# Patient Record
Sex: Female | Born: 1970 | Race: White | Hispanic: No | Marital: Married | Smoking: Never smoker
Health system: Southern US, Community
[De-identification: ages and names within clinical notes are randomized; demographics above are authoritative.]

## PROBLEM LIST (undated history)

## (undated) DIAGNOSIS — N951 Menopausal and female climacteric states: Principal | ICD-10-CM

## (undated) DIAGNOSIS — Z1329 Encounter for screening for other suspected endocrine disorder: Secondary | ICD-10-CM

## (undated) DIAGNOSIS — I10 Essential (primary) hypertension: Principal | ICD-10-CM

## (undated) DIAGNOSIS — Z1211 Encounter for screening for malignant neoplasm of colon: Secondary | ICD-10-CM

## (undated) DIAGNOSIS — E782 Mixed hyperlipidemia: Principal | ICD-10-CM

## (undated) DIAGNOSIS — K219 Gastro-esophageal reflux disease without esophagitis: Secondary | ICD-10-CM

## (undated) DIAGNOSIS — H5704 Mydriasis: Secondary | ICD-10-CM

## (undated) DIAGNOSIS — Z01419 Encounter for gynecological examination (general) (routine) without abnormal findings: Principal | ICD-10-CM

## (undated) DIAGNOSIS — G43909 Migraine, unspecified, not intractable, without status migrainosus: Secondary | ICD-10-CM

## (undated) DIAGNOSIS — R202 Paresthesia of skin: Principal | ICD-10-CM

## (undated) DIAGNOSIS — F329 Major depressive disorder, single episode, unspecified: Secondary | ICD-10-CM

## (undated) DIAGNOSIS — F32A Depression, unspecified: Secondary | ICD-10-CM

## (undated) HISTORY — DX: Major depressive disorder, single episode, unspecified: F32.9

## (undated) HISTORY — PX: NASAL SINUS SURGERY: SHX719

## (undated) HISTORY — DX: Depression, unspecified: F32.A

---

## 1998-07-06 ENCOUNTER — Ambulatory Visit: Admission: RE | Admit: 1998-07-06 | Discharge: 1998-07-06 | Payer: Self-pay

## 1998-09-18 ENCOUNTER — Ambulatory Visit: Admission: RE | Admit: 1998-09-18 | Discharge: 1998-09-18 | Payer: Self-pay | Admitting: Otolaryngology

## 2006-05-19 ENCOUNTER — Emergency Department (HOSPITAL_COMMUNITY): Admission: EM | Admit: 2006-05-19 | Discharge: 2006-05-19 | Payer: Self-pay | Admitting: Family Medicine

## 2010-03-06 HISTORY — PX: BREAST BIOPSY: SHX20

## 2012-06-13 ENCOUNTER — Encounter: Payer: Self-pay | Admitting: Certified Nurse Midwife

## 2012-06-17 ENCOUNTER — Encounter: Payer: Self-pay | Admitting: Certified Nurse Midwife

## 2012-06-17 ENCOUNTER — Telehealth: Payer: Self-pay | Admitting: Certified Nurse Midwife

## 2012-06-17 ENCOUNTER — Ambulatory Visit (INDEPENDENT_AMBULATORY_CARE_PROVIDER_SITE_OTHER): Payer: BC Managed Care – PPO | Admitting: Certified Nurse Midwife

## 2012-06-17 ENCOUNTER — Other Ambulatory Visit: Payer: Self-pay | Admitting: *Deleted

## 2012-06-17 VITALS — BP 120/80 | Ht 65.5 in | Wt 215.0 lb

## 2012-06-17 DIAGNOSIS — Z01419 Encounter for gynecological examination (general) (routine) without abnormal findings: Secondary | ICD-10-CM

## 2012-06-17 DIAGNOSIS — N644 Mastodynia: Secondary | ICD-10-CM

## 2012-06-17 DIAGNOSIS — Z0000 Encounter for general adult medical examination without abnormal findings: Secondary | ICD-10-CM

## 2012-06-17 DIAGNOSIS — E669 Obesity, unspecified: Secondary | ICD-10-CM | POA: Insufficient documentation

## 2012-06-17 LAB — COMPREHENSIVE METABOLIC PANEL
Alkaline Phosphatase: 89 U/L (ref 39–117)
Glucose, Bld: 96 mg/dL (ref 70–99)
Sodium: 139 mEq/L (ref 135–145)
Total Bilirubin: 0.3 mg/dL (ref 0.3–1.2)
Total Protein: 7.1 g/dL (ref 6.0–8.3)

## 2012-06-17 LAB — LIPID PANEL
HDL: 32 mg/dL — ABNORMAL LOW (ref >39)
LDL Cholesterol: 56 mg/dL (ref 0–99)
Total CHOL/HDL Ratio: 4.6 Ratio

## 2012-06-17 LAB — POCT URINALYSIS DIPSTICK
Glucose, UA: NEGATIVE
Leukocytes, UA: NEGATIVE
Nitrite, UA: NEGATIVE
Urobilinogen, UA: NEGATIVE

## 2012-06-17 NOTE — Progress Notes (Signed)
42 y.o. Married Caucasian female   [phone] here for annual exam. Periods normal , no issues.  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  Feels very fatigued at times, all 3 children go with her to work.  Patient has a special needs child age 54, legally blind and mentally challenged. Poor community and social resources available to her.  Spouse works full time and does help.  Recently stopped breast feeding last child.  Has tried to lose weight and really doesn't eat "a lot".  Usually one meal a day. Exercise with household and children.  Family leaves in [place], she lives in [place].  Fasting for lab work, was told at one point she had high glucose?? Does feel depressed, "I know what that feels like"   No other health issues.  Patient's last menstrual period was 06/02/2012.          Sexually active: yes  The current method of family planning is condom use all of the time.    Exercising: no  exercise Last mammogram: 2012 breast biopsy Last pap: 2013 Last BMD: none Alcohol: none Tobacco: none Colonoscopy: none  No health maintenance topics applied.  Family History  Problem Relation Age of Onset  . Hypertension Father   . Heart disease Father      age: in 42's heart attack  . Diabetes Maternal Aunt   . Breast cancer Paternal Aunt   . Diabetes Maternal Grandfather     There is no problem list on file for this patient.   Past Medical History  Diagnosis Date  . Depression     Past Surgical History  Procedure Laterality Date  . Cesarean section      times 3  . Nasal sinus surgery    . Breast biopsy Left 2012    Allergies: Penicillins  No current outpatient prescriptions on file.   No current facility-administered medications for this visit.    ROS: A comprehensive review of systems was negative.  Exam:    BP 120/80  Ht 5' 5.5" (1.664 m)  Wt 215 lb (97.523 kg)  BMI 35.22 kg/m2  LMP 06/02/2012 Weight change: @WEIGHTCHANGE @ Last 3 height  recordings:  Ht Readings from Last 3 Encounters:  06/17/12 5' 5.5" (1.664 m)   General appearance: alert, cooperative and appears stated age smiling. Father with her today to help with children Head: Normocephalic, without obvious abnormality, atraumatic Neck: no adenopathy, supple, symmetrical, trachea midline and thyroid not enlarged, symmetric, no tenderness/mass/nodules Lungs: clear to auscultation bilaterally Breasts: normal appearance, no masses or tenderness, No nipple retraction or dimpling Heart: regular rate and rhythm Abdomen: soft, non-tender; bowel sounds normal; no masses,  no organomegaly Extremities: extremities normal, atraumatic, no cyanosis or edema Skin: Skin color, texture, turgor normal. No rashes or lesions Lymph nodes: Cervical, supraclavicular, and axillary nodes normal. no inguinal nodes palpated Neurologic: Alert and oriented X 3, normal strength and tone. Normal symmetric reflexes. Normal coordination and gait   Pelvic: External genitalia:  no lesions              Urethra: normal appearing urethra with no masses, tenderness or lesions              Bartholins and Skenes: Bartholin's, Urethra, Skene's normal                 Vagina: normal appearing vagina with normal color and discharge, no lesions  Cervix: normal appearance              Pap taken: yes        Bimanual Exam:  Uterus:  uterus is normal size, shape, consistency and nontender, anteverted                                      Adnexa:    normal adnexa in size, nontender and no masses                                      Rectovaginal: Confirms                                      Anus:  normal sphincter tone, no lesions  A: Well woman exam,obese Contraception: Condoms Social stress with family and limited resources History of depression     P:Reviewed health and wellness pertinent to exam Mammogram yearly, pap smear as indicated Discussed the importance of regular meals to combat  fatigue and obesity. Also discussed exercise does help with weight and feelings of being overwhelmed.  Offered nutritional assessment referral.  Will consider and advise Labs CMP,Lipid panel,TSH, Vitamin D Discussed seeking family and friends support to help with family challenges, look at other resources outside the public sector in [place] with daughter. Discussed that sometimes fatigue is a sign of depression, patient aware but feels this is situational, will advise if feels it depression for medication use.  return annually or prn     Reviewed, TL  An After Visit Summary was printed and given to the patient.

## 2012-06-17 NOTE — Patient Instructions (Signed)

## 2012-06-17 NOTE — Telephone Encounter (Signed)
Order faxed to [facility] for Diagnostic Mammogram with Ultrasound if necessary for Breast Pain. Rachael Mcintyre. Per order of D.Leonard,CNM

## 2012-06-17 NOTE — Telephone Encounter (Signed)
[facility] calling fo a diagnostic mammo order pt is there now waiting on the order

## 2012-06-19 LAB — IPS PAP TEST WITH HPV

## 2012-06-20 ENCOUNTER — Telehealth: Payer: Self-pay | Admitting: Certified Nurse Midwife

## 2012-06-20 LAB — HEMOGLOBIN, FINGERSTICK: Hemoglobin, fingerstick: 13.9 g/dL (ref 12.0–16.0)

## 2012-06-20 NOTE — Telephone Encounter (Signed)
Amy and I did not know if you needed this information. ??sue

## 2012-06-20 NOTE — Telephone Encounter (Signed)
Will need office labs faxed to their office from her last visit

## 2012-06-20 NOTE — Telephone Encounter (Signed)
Pt calling to give the nurse information for the doctor she will be going to. Dr Cathlyn Parsons phone number [phone] and fax number is [phone]

## 2012-06-24 ENCOUNTER — Encounter: Payer: Self-pay | Admitting: *Deleted

## 2012-06-24 NOTE — Telephone Encounter (Signed)
No because it is incomplete until final report in

## 2012-06-24 NOTE — Telephone Encounter (Signed)
All lab results faxed to Dr. Cathlyn Parsons.

## 2012-06-26 ENCOUNTER — Telehealth: Payer: Self-pay

## 2012-06-26 NOTE — Telephone Encounter (Signed)
Patient put in 02 recall for 4/15

## 2014-01-05 ENCOUNTER — Encounter: Payer: Self-pay | Admitting: Certified Nurse Midwife

## 2015-01-20 NOTE — Procedures (Signed)
Formal Overnight Titration    [facility]. Elgie Collard, MD  Service Date: 01/20/2015    REFERRING PHYSICIAN:  Dr. Minus Liberty.    REASON FOR REFERRAL:  Probable obstructive sleep apnea.    HISTORY OF PRESENT ILLNESS:  This is a 44 year old female referred to  the [facility] who is married with 3 children, 3, 55  and 30 years of age and she is a Psychologist, sport and exercise.  Her biggest complaint  was she was always feeling tired and was snoring quite a bit.  It has  been going on for years and she would wake up a lot in the night.  She  is 5 feet 7 inches tall, weighs 195 pounds and has gained about 10  pounds in the last year.  She sleeps in the same room and bed with her  husband.  She is allergic to penicillin, has a history of depression  and has had cesarean sections in the past.  She is on Zoloft 50 mg a  day for a year.  There is a family history of obstructive sleep apnea  and hypertension, both in a brother and in her father.  The patient  has had a sleep study done previously in [place], [place] 12  years prior.  We do not have the results of that test.  She goes to  sleep about 10-11, gets up at 5:00 in the morning and on the weekends  or days that she does not work, she may sleep in until about 8:00 in  the morning.  She does not drink coffee, but she does drink  decaffeinated coffee and does drink tea.  She does not smoke and does  not use illicit drugs.  She functions best in the morning and in the  afternoon and worst in the evening.  She feels like she gets too  little sleep at night, has trouble falling asleep at night, waking up  in the morning and feels like her sleep is unsatisfactory.  She has  headaches in the morning and complains of snoring and gasping at  night.  She feels extremely tired and fatigued during the day and  sleepy and feels like her nighttime problems seem to affect her  daytime activity.  Her Epworth sleepiness score is not available in  the record.  For that  reason, she was set up for a whole night  polysomnogram at the [facility].    PROCEDURE:  A complete polysomnogram was obtained by measuring the  digital parameters recommended by The American Academy of Sleep  Medicine including EEG in 2 locations, EOG over the eyes, EMG of the  chin and legs, nasal and oral air flow, thoracic and abdominal effort  and continuous arterial oximetry as well as EKG.  Sleep and  respiration was scored in the standard, approved, accepted fashion.   This was done by a  registered polysomnographic technologist manually  on an epoch-by-epoch basis.     RESULTS:  Total recording time for the entire study was 504.1 minutes,  a total sleep time of 355.5 minutes, sleep efficiency of 70.5%.  Sleep  latency was 69.5 minutes, REM latency was 257.5 minutes.  She spent  15% of sleep time in stage I sleep, 55.8% in stage II sleep, 16.9% in  delta sleep and 12.2% in REM sleep.  No periodic limb movements were  seen.  The patient was studied on no nasal CPAP for the first 121  minutes of sleep time and had no REM sleep recorded at that level of  CPAP pressure.  The patient had 19 obstructive apneas and 1 central  apneic spell, as well as 60 obstructive hypopneas for overall  apnea-hypopnea index of 39.7 per hour.  Arousal index was very high at  50.1 per hour with O2 saturations dropping to as low as 88%.  She was  placed on 5, 7 and finally 9 cm CPAP pressure and did best on 9 cm  CPAP pressure, having been studied for 211.9 minutes of sleep with  43.5 minutes of REM sleep recorded.  The patient had only 2 central  apneic spells recorded on 9 cm CPAP pressure and the arousal index  dropped down to 18.1 per hour.  O2 saturations did not drop below 92%.   The patient spent 99.4% of total sleep time in supine position and  all of her events occurred in the supine position.  The REM index was  zero but REM sleep did not occur until she was on the highest level of  CPAP pressure.   The arousal index remained high throughout but was  lowest at the highest CPAP pressure recorded.    IMPRESSION:  Significant obstructive sleep apnea with desaturations  and sleep fragmentation, responds favorably to 9 cm CPAP pressure.   The patient did have delayed sleep onset with a long sleep latency as  well.  She may suffer from difficulty with initiation asleep and  maintenance of sleep, however, majority of that clearly is related to  her obstructive sleep apnea.    RECOMMENDATIONS:  Set the patient up with 9 cm CPAP pressure as soon  as possible as an outpatient.      Imogene BurnJ. Austin  Ball, MD  TR: *n DD: 01/27/2015 10:15 TD: 01/27/2015 11:18 Job#: [id number]     DOC#: [id number]           cc:  Otho BellowsJan Hendrick Burger MD

## 2015-02-04 LAB — HM MAMMOGRAPHY

## 2015-02-04 LAB — HM PAP SMEAR

## 2017-02-07 ENCOUNTER — Ambulatory Visit (INDEPENDENT_AMBULATORY_CARE_PROVIDER_SITE_OTHER): Payer: BLUE CROSS/BLUE SHIELD | Admitting: Family Medicine

## 2017-02-07 ENCOUNTER — Encounter: Payer: Self-pay | Admitting: Family Medicine

## 2017-02-07 VITALS — BP 124/80 | HR 92 | Ht 65.25 in | Wt 221.4 lb

## 2017-02-07 DIAGNOSIS — R1013 Epigastric pain: Secondary | ICD-10-CM

## 2017-02-07 LAB — COMPREHENSIVE METABOLIC PANEL
ALBUMIN: 3.9 g/dL (ref 3.5–5.2)
ALT: 14 U/L (ref 0–35)
AST: 15 U/L (ref 0–37)
Alkaline Phosphatase: 71 U/L (ref 39–117)
BUN: 15 mg/dL (ref 6–23)
CHLORIDE: 104 meq/L (ref 96–112)
CO2: 25 mEq/L (ref 19–32)
Calcium: 8.7 mg/dL (ref 8.4–10.5)
Creatinine, Ser: 0.76 mg/dL (ref 0.40–1.20)
GFR: 87.23 mL/min (ref >60.00)
Glucose, Bld: 88 mg/dL (ref 70–99)
POTASSIUM: 4.4 meq/L (ref 3.5–5.1)
SODIUM: 138 meq/L (ref 135–145)
Total Bilirubin: 0.4 mg/dL (ref 0.2–1.2)
Total Protein: 6.6 g/dL (ref 6.0–8.3)

## 2017-02-07 LAB — CBC
HEMATOCRIT: 40.1 % (ref 36.0–46.0)
Hemoglobin: 13.4 g/dL (ref 12.0–15.0)
MCHC: 33.3 g/dL (ref 30.0–36.0)
MCV: 90.8 fl (ref 78.0–100.0)
Platelets: 240 10*3/uL (ref 150.0–400.0)
RBC: 4.41 Mil/uL (ref 3.87–5.11)
RDW: 13.3 % (ref 11.5–15.5)
WBC: 8.1 10*3/uL (ref 4.0–10.5)

## 2017-02-07 LAB — LIPID PANEL
CHOL/HDL RATIO: 4
CHOLESTEROL: 126 mg/dL (ref 0–200)
HDL: 32.9 mg/dL — ABNORMAL LOW (ref >39.00)
NonHDL: 92.71
Triglycerides: 294 mg/dL — ABNORMAL HIGH (ref 0.0–149.0)
VLDL: 58.8 mg/dL — AB (ref 0.0–40.0)

## 2017-02-07 LAB — LIPASE: LIPASE: 13 U/L (ref 11.0–59.0)

## 2017-02-07 LAB — H. PYLORI ANTIBODY, IGG: H Pylori IgG: NEGATIVE

## 2017-02-07 LAB — LDL CHOLESTEROL, DIRECT: Direct LDL: 59 mg/dL

## 2017-02-07 LAB — TSH: TSH: 1.63 u[IU]/mL (ref 0.35–4.50)

## 2017-02-07 NOTE — Patient Instructions (Signed)
We will check blood work today. We may need a cat scan if we dont find an answer.  We will call you with the next steps once the results come back  Take care,  Dr Jimmey RalphParker

## 2017-02-07 NOTE — Progress Notes (Signed)
Subjective:  Rachael Mcintyre is a 46 y.o. female who presents today with a chief complaint of abdominal pain and to establish care.   HPI:  Abdominal Pain, New Issue Several year history.  Noticed that her symptoms started after the birth of her last child about 10 years ago.  Patient notes that she has had a history of 3 prior C-sections.  Was told that after her last C-section that she should not have any further children due to the significant amount of scar tissue that she had in her abdomen and uterus.  Symptoms have been stable over the last couple of years.  Pain is mostly located in her upper abdomen, however can be located all over.  Pain is described as a sharp, pulling sensation.  Pain is little worse after eating.  Occasionally will get bloating after eating.  She has made several dietary modifications including stopping potatoes, bread, and other starchy foods.  No this is significantly seem to help.  Over the past 2 weeks she has noticed that she has become more constipated.  Occasional nausea without vomiting.  Pain is causing her to have more back and neck pain.  Patient is concerned because she is taking care of her 45 year old disabled child and is not sure if the pain persist if she will be able to adequately care for her.  No fevers or chills.  ROS: Per HPI, otherwise a 14 point review of systems was performed and was negative  PMH:  The following were reviewed and entered/updated in epic: Past Medical History:  Diagnosis Date  . Depression    Patient Active Problem List   Diagnosis Date Noted  . Obesity, unspecified 06/17/2012  . Mastodynia 06/17/2012   Past Surgical History:  Procedure Laterality Date  . BREAST BIOPSY Left 2012  . CESAREAN SECTION     times 3  . NASAL SINUS SURGERY      Family History  Problem Relation Age of Onset  . Hypertension Father   . Heart disease Father         age: in 650's heart attack  . Alcohol abuse Father   . Early  death Father   . Hyperlipidemia Father   . Diabetes Maternal Aunt   . Breast cancer Paternal Aunt   . Diabetes Maternal Grandfather   . Hypertension Mother   . Hypertension Son   . Early death Paternal Grandmother   . Hyperlipidemia Paternal Grandmother   . Hypertension Paternal Grandmother   . Stroke Paternal Grandmother   . Early death Paternal Grandfather   . Hypertension Brother   . Seizures Brother     Medications- reviewed and updated Current Outpatient Medications  Medication Sig Dispense Refill  . venlafaxine (EFFEXOR) 25 MG tablet Take 25 mg by mouth once.     No current facility-administered medications for this visit.     Allergies-reviewed and updated Allergies  Allergen Reactions  . Penicillins Swelling    Social History   Socioeconomic History  . Marital status: Married    Spouse name: None  . Number of children: None  . Years of education: None  . Highest education level: None  Social Needs  . Financial resource strain: None  . Food insecurity - worry: None  . Food insecurity - inability: None  . Transportation needs - medical: None  . Transportation needs - non-medical: None  Occupational History  . None  Tobacco Use  . Smoking status: Never Smoker  . Smokeless tobacco: Never  Used  Substance and Sexual Activity  . Alcohol use: No  . Drug use: No  . Sexual activity: Yes    Partners: Male    Birth control/protection: Condom  Other Topics Concern  . None  Social History Narrative  . None   Objective:  Physical Exam: BP 124/80   Pulse 92   Ht 5' 5.25" (1.657 m)   Wt 221 lb 6.4 oz (100.4 kg)   LMP 01/15/2017   SpO2 99%   BMI 36.56 kg/m   Gen: NAD, resting comfortably CV: RRR with no murmurs appreciated Pulm: NWOB, CTAB with no crackles, wheezes, or rhonchi GI: Obese, normal bowel sounds present. Soft, Nontender, Nondistended. MSK: No edema, cyanosis, or clubbing noted Skin: Warm, dry Neuro: Grossly normal, moves all  extremities Psych: Normal affect and thought content  Assessment/Plan:  Epigastric pain Unclear etiology.  Broad differential.  Will start with basic lab work including CBC, CMET, lipase, H. pylori, TSh.  Will likely need abdominal CT if above workup negative.  She has recent constipation which may be contributing, however given that this is relatively new symptom doubt that this is the main source of her pain.  Given her history of significant scar tissue may eventually require surgical consultation.  She does not have any signs or symptoms of a bowel obstruction currently.  Return precautions reviewed.  Preventive healthcare Up-to-date on flu shot.  Will check lipid panel with blood work today.  Time Spent: I spent 45 minutes face-to-face with the patient, with more than half spent on counseling for the diagnostic workup for her abdominal pain including laboratory testing, advanced imaging, and possible surgical referral.  Additionally spent time on discussion of warning signs and reasons to return to care.   Katina Degreealeb M. Jimmey RalphParker, MD 02/07/2017 10:15 AM

## 2017-02-08 NOTE — Progress Notes (Signed)
Her triglycerides were little high and her  "good" cholesterol was a little low.  No need to start medications.  Continue working on diet and exercise.    Other labs are all normal-no explanation for abdominal pain.  We will need to get an abdominal CT scan with contrast for further evaluation.  Please inform patient.  Katina Degreealeb M. Jimmey RalphParker, MD 02/08/2017 12:48 PM

## 2017-02-09 ENCOUNTER — Other Ambulatory Visit: Payer: Self-pay

## 2017-02-09 DIAGNOSIS — R1084 Generalized abdominal pain: Secondary | ICD-10-CM

## 2017-02-09 DIAGNOSIS — R109 Unspecified abdominal pain: Secondary | ICD-10-CM

## 2017-02-13 ENCOUNTER — Encounter: Payer: Self-pay | Admitting: Family Medicine

## 2017-02-28 ENCOUNTER — Other Ambulatory Visit: Payer: Self-pay | Admitting: Family Medicine

## 2017-02-28 ENCOUNTER — Ambulatory Visit
Admission: RE | Admit: 2017-02-28 | Discharge: 2017-02-28 | Disposition: A | Discharge status: Home / Self Care | Payer: BLUE CROSS/BLUE SHIELD | Source: Ambulatory Visit | Attending: Family Medicine | Admitting: Family Medicine

## 2017-02-28 DIAGNOSIS — R109 Unspecified abdominal pain: Secondary | ICD-10-CM

## 2017-02-28 DIAGNOSIS — R1084 Generalized abdominal pain: Secondary | ICD-10-CM

## 2017-02-28 MED ORDER — IOPAMIDOL (ISOVUE-300) INJECTION 61%
100.0000 mL | Freq: Once | INTRAVENOUS | Status: AC | PRN
  Administered 2017-02-28: 100 mL via INTRAVENOUS

## 2017-03-01 ENCOUNTER — Telehealth: Payer: Self-pay | Admitting: Family Medicine

## 2017-03-01 NOTE — Telephone Encounter (Signed)
See note

## 2017-03-01 NOTE — Telephone Encounter (Signed)
Copied from CRM #[id number]. Topic: Quick Communication - See Telephone Encounter °>> Mar 01, 2017 12:01 PM Lambe, Annette S wrote: °CRM for notification. See Telephone encounter for:  °Patient is calling wanting CT Results.  She is in [place] today. °03/01/17. °

## 2017-03-01 NOTE — Telephone Encounter (Deleted)
Copied from CRM [phone]. Topic: Quick Communication - See Telephone Encounter >> Mar 01, 2017 12:01 PM Cipriano BunkerLambe, Annette S wrote: CRM for notification. See Telephone encounter for:  Patient is calling wanting CT Results.  [place]. 03/01/17.

## 2017-03-02 NOTE — Telephone Encounter (Signed)
Pt calling again today and would like a call about her CT results and if they can be released to be in her MyChart. Please advise.

## 2017-03-02 NOTE — Telephone Encounter (Signed)
Please see note and call patient

## 2017-03-02 NOTE — Telephone Encounter (Signed)
Spoke with patient and gave results.  Patient verbalized understanding.  Also sending results and recommendations via MyChart.

## 2017-03-02 NOTE — Progress Notes (Signed)
No structural abnormalities found on patient's CT scan. She does have a large amount of constipation that could be causing her symptoms - including the diarrhea. She should start a bowel regimen of colace 200mg  2-3 times daily and 1-2 packets of miralax daily as needed to have 1-2 soft bowel movements daily.  She should also be on a fiber supplement if she is not already on one and make sure to drink plenty of fluids.   I would like to see her back within the next couple of weeks to re-evaluated and discuss further.  Katina Degreealeb M. Jimmey RalphParker, MD 03/02/2017 8:56 AM

## 2017-03-04 ENCOUNTER — Encounter: Payer: Self-pay | Admitting: Family Medicine

## 2017-03-05 ENCOUNTER — Other Ambulatory Visit: Payer: Self-pay

## 2017-03-05 MED ORDER — VENLAFAXINE HCL 25 MG PO TABS: 25.0000 mg | ORAL_TABLET | Freq: Once | ORAL | 0 refills | Status: AC

## 2019-10-06 LAB — HEMOGLOBIN A1C
Estimated Avg Glucose, External: 94 mg/dL (ref 77–114)
Hemoglobin A1C, External: 4.9 % (ref 4.3–5.6)

## 2020-05-05 LAB — CBC WITH AUTO DIFFERENTIAL
Absolute Baso #: 0.1 10*3/uL (ref 0.0–0.2)
Absolute Eos #: 0.2 10*3/uL (ref 0.0–0.5)
Absolute Lymph #: 2.3 10*3/uL (ref 1.0–3.2)
Absolute Mono #: 0.6 10*3/uL (ref 0.3–1.0)
Basophils %: 0.7 % (ref 0.0–2.0)
Eosinophils %: 1.3 % (ref 0.0–7.0)
Hematocrit: 39.1 % (ref 34.0–47.0)
Hemoglobin: 13.4 g/dL (ref 11.5–15.7)
Immature Grans (Abs): 0.12 10*3/uL — ABNORMAL HIGH (ref 0.00–0.06)
Immature Granulocytes: 1 % — ABNORMAL HIGH (ref 0.0–0.6)
Lymphocytes: 19.4 % (ref 15.0–45.0)
MCH: 30.6 pg (ref 27.0–34.5)
MCHC: 34.3 g/dL (ref 32.0–36.0)
MCV: 89.3 fL (ref 81.0–99.0)
MPV: 9.9 fL (ref 7.2–13.2)
Monocytes: 5.4 % (ref 4.0–12.0)
NRBC Absolute: 0 10*3/uL (ref 0.000–0.012)
NRBC Automated: 0 % (ref 0.0–0.2)
Neutrophils %: 72.2 % (ref 42.0–74.0)
Neutrophils Absolute: 8.4 10*3/uL — ABNORMAL HIGH (ref 1.6–7.3)
Platelets: 306 10*3/uL (ref 140–440)
RBC: 4.38 x10e6/mcL (ref 3.60–5.20)
RDW: 13.1 % (ref 11.0–16.0)
WBC: 11.6 10*3/uL — ABNORMAL HIGH (ref 3.8–10.6)

## 2020-05-05 LAB — PROGESTERONE: Progesterone: 15.99 ng/mL

## 2020-05-05 LAB — TSH WITH REFLEX TO FT4: TSH: 1.72 mcIU/mL (ref 0.358–3.740)

## 2020-05-05 LAB — FOLLICLE STIMULATING HORMONE: FSH: 4.02 IU/mL

## 2020-05-07 LAB — TESTOSTERONE, FREE/TOT EQUILIB
Testosterone % Free: 1.53 % (ref 0.50–2.80)
Testosterone, Free: 0.57 ng/dL (ref 0.10–0.85)
Testosterone: 37 ng/dL (ref 4–50)

## 2021-02-03 ENCOUNTER — Encounter: Admit: 2021-02-03 | Discharge: 2021-02-04 | Payer: MEDICAID | Attending: Obstetrics & Gynecology

## 2021-02-03 DIAGNOSIS — Z01419 Encounter for gynecological examination (general) (routine) without abnormal findings: Secondary | ICD-10-CM

## 2021-02-03 MED ORDER — MEDROXYPROGESTERONE ACETATE 10 MG PO TABS
10 MG | ORAL_TABLET | Freq: Every day | ORAL | 3 refills | Status: DC
Start: 2021-02-03 — End: 2021-05-31

## 2021-02-03 NOTE — Addendum Note (Signed)
Addended by: Judithe Modest on: 02/03/2021 03:55 PM     Modules accepted: Orders

## 2021-02-03 NOTE — Progress Notes (Signed)
CC: Patient presents today for Annual Exam (Pt is here for yearly exam.)    HPI: see assessment and plan    OB History       Gravida   4    Para   3    Term   2    Preterm   1    AB   1    Living   3         SAB   1    IAB        Ectopic        Molar        Multiple        Live Births   3                GYN History     No results found for: Paulita Cradle, METHOD, [id number], HPV16      Past Medical History:  Past Medical History:   Diagnosis Date    Anxiety     Hypertension     Sleep apnea        Past Surgical History:  Past Surgical History:   Procedure Laterality Date    CESAREAN SECTION      DILATION AND CURETTAGE      SINUS SURGERY         Allergies:   Allergies   Allergen Reactions    Penicillins Anaphylaxis and Swelling     Other reaction(s): swelling  swelling  swelling         Medication History:  Current Outpatient Medications   Medication Sig Dispense Refill    fluticasone (FLONASE) 50 MCG/ACT nasal spray twice a day      lisinopril (PRINIVIL;ZESTRIL) 5 MG tablet 1 tablet Orally Once a day for 30 day(s)      nortriptyline (PAMELOR) 10 MG capsule 3 Orally hs for 30 day(s)      sertraline (ZOLOFT) 50 MG tablet 1 tablet Orally Once a day      zolpidem (AMBIEN) 10 MG tablet 1 tablet at bedtime as needed Orally Once a day for 30 days       No current facility-administered medications for this visit.       Social History:  Social History     Socioeconomic History    Marital status: Unknown     Spouse name: Not on file    Number of children: Not on file    Years of education: Not on file    Highest education level: Not on file   Occupational History    Not on file   Tobacco Use    Smoking status: Never    Smokeless tobacco: Never   Vaping Use    Vaping Use: Never used   Substance and Sexual Activity    Alcohol use: Not Currently    Drug use: Never    Sexual activity: Yes     Partners: Male   Other Topics Concern    Not on file   Social History Narrative    Not on file     Social Determinants of Health      Financial Resource Strain: Not on file   Food Insecurity: Not on file   Transportation Needs: Not on file   Physical Activity: Not on file   Stress: Not on file   Social Connections: Not on file   Intimate Partner Violence: Not on file   Housing Stability: Not on file       Family  History:  Family History   Problem Relation Age of Onset    Diabetes Paternal Grandmother     Heart Disease Paternal Grandmother     Diabetes Maternal Grandmother     Stroke Maternal Grandmother     Heart Attack Father        Objective:   BP 136/75    Wt 234 lb (106.1 kg)   Urine:@LASTPROCAMB (POC81001;POC81003;poc81002;poc81000;POC81001F;POC81003E)@  Hemoglobin: No components found for: [facility]   The patient appears well, alert, oriented x 3, in no distress.    PHYSICAL EXAM:    General Examination:  GENERAL APPEARANCE: alert, well hydrated, in no distress.   HEAD: normocephalic, atraumatic.   EYES: no lid lag, stare or proptosis, extraocular movement full and smooth.   NECK: thyroid normal.   RESPIRATORY: unlabored.   ABDOMEN: normal, soft, nontender, nondistended.   SKIN: normal, no suspicious lesions.   EXTREMITIES: no edema, normal, full range of motion.   NEUROLOGIC: alert and oriented, gait normal.       BREAST EXAM:    General Examination:  GENERAL APPEARANCE: pleasant, well nourished, well developed, in no acute distress.   NECK: no thyromegaly, neck supple.   LUNGS: good air movement.   BREASTS: no dimpling, no discharge, no masses palpable bilaterally, nontender.   ABDOMEN: soft, nontender, nondistended.            PELVIC EXAM:   Examination:   Gynecological:  EXTERNAL GENITALIA: normal, introitus normal.   URETHRA: meatus normal.   BLADDER: normal.   VAGINA: healthy pink mucosa without any lesions.   CERVIX: no cervical movement tenderness, no lesions.   UTERUS: normal mobility, nontender.   ADNEXA: no mass, nontender.   ANUS/PERINEUM: normal.                Assessment/Plan:   02/03/2021 annual, 50 year old P3 single mother who  cares for one of her children who is severely disabled.  She has a history of irregular cycles and have been giving her Provera withdrawal every 35 days but she ran out of it and has not had a flow in about 2 months so I will refill that and recommend that she take it monthly if she does not have a flow.  Mammogram order given.  I will refer her for colonoscopy screening and Hemoccult was negative today.  I will also refer her to establish care with primary care.  She stopped her Pamelor and has been off of it for about 3 months but does see cognitive behavioral therapy for her depression and PTSD.  Monthly SBE recommended with a healthy lifestyle.  Pap smear performed.  Headache without    09/20/20 follow-up virtual visit, this is a 49 year old P3 single mother  who also cares for one of her children who is disabled. In the past she  was having irregular cycles and I given her progesterone withdrawal  to take every 35 days but she actually has not needed it for the last 3  or 4 months and her cycles are coming about once a month only  lasting 3 days and light. I will also given her Pamelor because she was  having some anxiety but it was giving her headaches that she  discontinued it and she is doing some cognitive behavioral therapy  and sees a counselor as well and is actually not any medication other  than her hypertension medication. I'll plan on seeing her in October  for her annual. She had her last mammogram and [facility] and says  that  her breasts were dense and was asking if she needed an MRI and I  offered her that option but explained that it may be costly and that I  highly recommend monthly self breast exams and notify me if she has  the abnormalities. We will schedule screening mammogram I see her  in October. It her PCP dislodged and [facility] recommend she establish  care with a new one with that group  05/19/20 virtual visit follow-up, 50 year old P3 single mother. Of note  one of her children is disabled and  very difficult for her to manage.  She is been struggling with menstrual cycles every other month and  recently started weight loss with exercise injection had a normal cycle  just recently. She is also try the Ambien for insomnia and she takes  half a tablet several times a week and she is also taking a natural  supplement at helping her as well. She still having pretty significant  depression as a single mom with a disabled child but had problems in  the past Zoloft I will initiate nortriptyline, Pamelor, at bedtime to see  if this helps and we'll plan for a virtual visit in 4 months to see how  she is doing.  1 insomnia,continue Ambien and natural remedies  2 metromenorrhagia, continue low carbohydrate diet, reviewed normal  hormones and FSH 4 we'll give Provera withdrawal as needed if she  does not have a cycle within 35 days and 6 refills given  Preventative, annual due in October  05/04/20 new patient problem visit, 50 year old P3 and one of her kids is  medical reasons in the past 12 months?  None  Alcohol Screen: Denies  Did you have a drink containing alcohol in  the past year? 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  Oldest child disabled 2/2 prematurity.  Gyn History  Periods : every month, x5-7d.  Last pap smear results normal.  Sexual activity not currently sexually active.  Last pap smear date 10/2019 Neg Pap, Neg  HPV.  Last mammogram date 12/2019- Benign.  Mammogram completed  within the last year no  Abnormal pap smear none.  Date of Last Period 04/12/20.  Sexually Transmitted Diseases (STDs) none.  Birth control condoms, timing.  Menarche:  Age of Menarche 24  OB History  GPAL: G4 P3 A1.  Pregnancy # 1: SAB.  Pregnancy # 2: 03/2006 - 1CS @ 25 wks, F,  420g. c/b IUGR, abnl dopplers. CP.  Pregnancy # 3 04/2008 - RCS, F, 7#5.  Pregnancy # 4: 12/2011 - RCS, F, 8#.  Allergies  pcn: swelling  Hospitalization/Major  Diagnostic Procedure  see  surgical  Review of  Systems  General/Constitutional:  Fever denies. Chills denies.  Headache denies. Fatigue denies. Weight  loss denies.  ENT:  Sore throat denies. Hearing  loss denies.  Ophthalmologic:  Double Vision denies. Blurred  vision denies.  Endocrine:  Denies Cold intolerance. Denies Heat  intolerance.  Respiratory:  Shortness of breath denies.  Cough denies. Wheezing denies.  Breast:  Breast lump denies. Breast  swelling denies. Nipple discharge denies.  Red skin denies. Tenderness denies. Skin  changes denies.  Cardiovascular:  Chest pain denies. Fainting denies.  significantly disabled, she is a single mother. Complaining of cycles  every other month which are light also feels that she hasn't hormone  imbalance because she's having difficulty losing weight and having  unusual sensation slight tingling in her  feet in her scalp. She has  anxiety and sees a Veterinary surgeon. She has difficulty sleeping as well.  1 insomnia;Will initiate Ambien and risk and benefits reviewed at  length  2 perimenopausal status;we'll check lab work and do a virtual visit to  review the symptoms, I think that she likely has some hormonal  imbalance and may benefit from treatment but she also has significant  anxiety and other social issues  3 preventative, mammogram due in October as well as Pap smear.

## 2021-02-07 LAB — PAP IG, LIQUID-BASED RFX APTIMA HPV WHEN HPV ASCU 16/18,45 (199340): .: 0

## 2021-02-10 ENCOUNTER — Ambulatory Visit: Admit: 2021-02-10 | Discharge: 2021-02-10 | Payer: MEDICAID | Attending: Family

## 2021-02-10 DIAGNOSIS — U071 COVID-19: Secondary | ICD-10-CM

## 2021-02-10 LAB — POC COVID-19 & INFLUENZA COMBO (LIAT IN HOUSE)
INFLUENZA A: NOT DETECTED
INFLUENZA B: NOT DETECTED
SARS-CoV-2: DETECTED — AB

## 2021-02-10 MED ORDER — IBUPROFEN 600 MG PO TABS
600 MG | ORAL_TABLET | Freq: Four times a day (QID) | ORAL | 0 refills | Status: AC | PRN
Start: 2021-02-10 — End: 2022-04-05

## 2021-02-10 MED ORDER — IBUPROFEN 600 MG PO TABS
600 MG | ORAL_TABLET | Freq: Four times a day (QID) | ORAL | 0 refills | Status: DC | PRN
Start: 2021-02-10 — End: 2021-02-10

## 2021-02-10 NOTE — Progress Notes (Signed)
Subjective:      Patient ID: Shannon Marshall     HPI The patient is a 50 y.o. female presents with complaints of subjective fever, headache, nasal congestion, rhinitis, sore throat, cough, shortness of breath, and diarrhea that began 02/05/2019.  Patient denies abdominal pain, nausea, and vomiting.  The patient's daughter is currently sick with COVID.    Patient also complains of neck pain for the past several days.  States that over the past 24 hours, she has developed pain that radiates into the left arm.  No musculoskeletal weakness or paresthesias.  No specific trauma, but the patient is the sole caregiver for her daughter who requires total care.  Patient states that she believes that she most likely transferred her daughter and injured herself in the process.  Has taken Tylenol with little relief in pain.    Review of Systems: As noted in the HPI    Past Medical History:   Diagnosis Date    Anxiety     Hypertension     Sleep apnea         Past Surgical History:   Procedure Laterality Date    CESAREAN SECTION      DILATION AND CURETTAGE      SINUS SURGERY          Allergies   Allergen Reactions    Penicillins Anaphylaxis and Swelling     Other reaction(s): swelling  swelling  swelling          Current Outpatient Medications   Medication Sig Dispense Refill    ibuprofen (ADVIL;MOTRIN) 600 MG tablet Take 1 tablet by mouth every 6 hours as needed for Pain 20 tablet 0    medroxyPROGESTERone (PROVERA) 10 MG tablet Take 1 tablet by mouth daily for 21 days Take monthly for 7 days if no menstrual cycle spontaneously 21 tablet 3    fluticasone (FLONASE) 50 MCG/ACT nasal spray twice a day      lisinopril (PRINIVIL;ZESTRIL) 5 MG tablet 1 tablet Orally Once a day for 30 day(s)       No current facility-administered medications for this visit.        BP 132/82    Pulse (!) 108    Temp 98.6 ??F (37 ??C)    Resp 20    Ht 5\' 6"  (1.676 m)    Wt 234 lb (106.1 kg)    SpO2 97%    BMI 37.77 kg/m??       Objective:   Physical  Exam  Vitals and nursing note reviewed.   Constitutional:       General: She is in acute distress.      Appearance: Normal appearance. She is normal weight. She is not toxic-appearing or diaphoretic.   HENT:      Head: Normocephalic and atraumatic.      Right Ear: Ear canal and external ear normal.      Left Ear: Ear canal and external ear normal.      Ears:      Comments: Bilateral tympanic membranes are noted to be intact and dull in appearance     Nose: Congestion and rhinorrhea present.      Mouth/Throat:      Mouth: Mucous membranes are moist.      Pharynx: No oropharyngeal exudate or posterior oropharyngeal erythema.   Eyes:      Extraocular Movements: Extraocular movements intact.      Conjunctiva/sclera: Conjunctivae normal.   Cardiovascular:      Rate and  Rhythm: Normal rate and regular rhythm.      Pulses: Normal pulses.      Heart sounds: Normal heart sounds. No murmur heard.    No friction rub. No gallop.   Pulmonary:      Effort: Pulmonary effort is normal. No respiratory distress.      Breath sounds: Normal breath sounds. No stridor. No wheezing, rhonchi or rales.   Musculoskeletal:      Comments: Tenderness to palpation over C5-C6, no crepitus or step-off appreciated, pain in the left upper back and left upper arm reproducible with range of motion at the neck, left shoulder nontender to palpation, left shoulder has full range of motion without pain   Skin:     General: Skin is warm and dry.   Neurological:      General: No focal deficit present.      Mental Status: She is alert and oriented to person, place, and time.   Psychiatric:         Mood and Affect: Mood normal.         Behavior: Behavior normal.         Thought Content: Thought content normal.         Judgment: Judgment normal.       No scans are attached to the encounter.     Assessment:   1. COVID-19  2. Acute cough  -     POC COVID-19 & Influenza Combo ([facility])  3. Cervical radiculopathy  -     ibuprofen (ADVIL;MOTRIN) 600 MG  tablet; Take 1 tablet by mouth every 6 hours as needed for Pain, Disp-20 tablet, R-0Normal     Plan:     Results for orders placed or performed in visit on 02/10/21   POC COVID-19 & Influenza Combo ([facility])   Result Value Ref Range    SARS-CoV-2 Detected (A) Not Detected    INFLUENZA A Not Detected Not Detected    INFLUENZA B Not Detected Not Detected    Narrative    Employed in healthcare setting?->  Resident in a congregate (group) care setting?->      Rapid COVID testing positive.  Rapid influenza testing negative. The patient was provided with verbal education regarding the Centers for Disease Control and Prevention guidelines.  Discussed symptomatic management.  Patient's neck pain is consistent with cervical radiculopathy.  We will place the patient on ibuprofen 600 mg every 6 to 8 hours with food.  Instructed on how to administer and possible side effects.  We did discuss applying heat to the neck for 15 to 20-minute increments minimum of 3 times daily.  Patient was given written discharge instructions that were downloaded from Up-to-Date.  Advised the patient to follow-up immediately for any new or worsening symptoms.  Strict ED precautions discussed.  The patient verbalized understanding and agreement with plan of care.           Vernelle Emerald, APRN - NP

## 2021-02-21 NOTE — Telephone Encounter (Signed)
Pt called and said she tried to schedule her colonoscopy and mammo central scheduling can not see the order. Pt is requesting new orders to be placed or faxed. Please assist

## 2021-02-24 NOTE — Telephone Encounter (Signed)
Colorectal Surgery           CPT   9728092771.         DX  Z12.11.         Surgical Procedure  Colonoscopy.         Place of service  Endoscopy.         Location  [facility].         Preparation  Miralax Gatorade.         Anesthesia  MAC.         Length of surgery  30 min.

## 2021-02-25 ENCOUNTER — Encounter

## 2021-02-25 NOTE — Telephone Encounter (Signed)
Please email packet  [email or web address]

## 2021-03-02 NOTE — Progress Notes (Signed)
Pre Procedure Patient Instructions    Procedure Location hospital:[facility]: [address], [place] - [place] in front of Hospital Main Entrance and check in at [employer].    Procedure Date 03/11/21  Arrival Time  0800      Medications:  Medication to be taken the morning of surgery with a few sips of water only:   Hold or reduce the dosage of the following medication, as discussed: Lisinopril  Do not take diet medications for 4 days prior to your procedure  Stop all supplements, vitamins and herbal remedies one week prior to your procedure, unless your doctor told you to continue taking.  Do not take over the counter pain medications except plain Tylenol or acetaminophen unless your doctor told you to do so.  If you are taking blood thinners, call the doctor performing your procedure and prescribing physician for instructions on when to stop.    Procedure Preparatio    Diet Restrictions:    Skin Preparation:   Wash with Hibiclens or an antibacterial soap (e.g. Dial soap) the night before and morning of procedure.  Using a clean washcloth, wash from neck to toes for 3 minutes.Gently clean the area where your surgery will be done thoroughly  Do not use Hibiclens on or near your face, eyes, ears, or head  Do not put on any deodorants, lotions, powders, or oils afterwards. Be sure to put on clean clothing.    Other Preparation:  Call your surgeon right away if you get any wounds, cuts, scrapes, scabs, rashes, bug bites at or near your operative site.  Bowel prep as instructed by your doctor  Fleets enema as instructed by your doctor  No test required before surgery      Day of Procedure Patient Instruction:  Do not smoke, vape, chew tobacco, drink alcohol or use recreational drugs on the day of your procedure  Remove all jewelry, piercings and metal accessories  Do not wear contacts, tampons, make-up, lotions, creams, powders, fragrances or deodorant  Do not bring valuables or money  Bring a picture ID and  insurance card and any of the following that are applicable to you:    ?? Inhalers    ?? CPAP or BiPAP machine    ?? Remote for spinal cord stimulator or other implanted device    ?? Insulin pump supplies    ?? Walker or other orthopedic device necessary for postop    ?? Storage case for eyeglasses, hearing aids, dentures, etc    ?? A loose button-up shirt if instructed    ?? Copy of your Living Will and/or Medical Durable Power of Attorney    ?? List of current medications including name and dosage    .  If you are going home the same day as your procedure, a support person should accompany you to the facility and must transport you home.  If you plan to take public transportation of any sort, your support person must accompany you home.  You will need someone to stay with you for 24 hours after your procedure with sedation of any kind.       COVID Testing Instructions:  No COVID testing required as discussed.       Comments:     The information and visitor policy was reviewed with you during your Pre-Admission Testing interview and you verbalized understanding. If you have any additional questions please contact [phone]    For financial questions regarding your procedure at a [facility] facility, please contact [phone], option 1

## 2021-03-08 NOTE — Telephone Encounter (Signed)
Patient is requesting a call back to  reschedule her 03/10/21 colonoscopy appointment. Patient has a cold and is on her menstrual cycle. Please advise

## 2021-03-09 NOTE — Telephone Encounter (Signed)
Please email packet  [email or web address]

## 2021-03-09 NOTE — Telephone Encounter (Signed)
Colorectal Surgery           CPT   45378.         DX  Z12.11.         Surgical Procedure  Colonoscopy.         Place of service  Endoscopy.         Location  [facility].         Preparation  Miralax Gatorade.         Anesthesia  MAC.         Length of surgery  30 min.

## 2021-03-23 ENCOUNTER — Inpatient Hospital Stay: Admit: 2021-03-23 | Payer: MEDICAID

## 2021-03-23 DIAGNOSIS — Z1231 Encounter for screening mammogram for malignant neoplasm of breast: Secondary | ICD-10-CM

## 2021-03-31 ENCOUNTER — Inpatient Hospital Stay: Payer: MEDICAID

## 2021-03-31 LAB — PREGNANCY, URINE: Pregnancy, Urine: NEGATIVE

## 2021-03-31 MED ORDER — PROPOFOL 200 MG/20ML IV EMUL
200 MG/20ML | INTRAVENOUS | Status: AC
Start: 2021-03-31 — End: ?

## 2021-03-31 MED ORDER — NORMAL SALINE FLUSH 0.9 % IV SOLN
0.9 % | INTRAVENOUS | Status: DC | PRN
Start: 2021-03-31 — End: 2021-03-31

## 2021-03-31 MED ORDER — LACTATED RINGERS IV SOLN
INTRAVENOUS | Status: DC
Start: 2021-03-31 — End: 2021-03-31
  Administered 2021-03-31: 15:00:00 via INTRAVENOUS

## 2021-03-31 MED ORDER — NORMAL SALINE FLUSH 0.9 % IV SOLN
0.9 % | Freq: Two times a day (BID) | INTRAVENOUS | Status: DC
Start: 2021-03-31 — End: 2021-03-31

## 2021-03-31 MED ORDER — PROPOFOL 200 MG/20ML IV EMUL
200 MG/20ML | INTRAVENOUS | Status: DC | PRN
Start: 2021-03-31 — End: 2021-03-31
  Administered 2021-03-31: 19:00:00 30 via INTRAVENOUS
  Administered 2021-03-31: 19:00:00 80 via INTRAVENOUS
  Administered 2021-03-31: 19:00:00 30 via INTRAVENOUS
  Administered 2021-03-31: 19:00:00 50 via INTRAVENOUS
  Administered 2021-03-31: 19:00:00 30 via INTRAVENOUS
  Administered 2021-03-31: 19:00:00 20 via INTRAVENOUS
  Administered 2021-03-31: 19:00:00 30 via INTRAVENOUS
  Administered 2021-03-31: 18:00:00 20 via INTRAVENOUS
  Administered 2021-03-31 (×5): 30 via INTRAVENOUS
  Administered 2021-03-31: 19:00:00 50 via INTRAVENOUS
  Administered 2021-03-31: 19:00:00 30 via INTRAVENOUS
  Administered 2021-03-31: 19:00:00 20 via INTRAVENOUS
  Administered 2021-03-31 (×2): 30 via INTRAVENOUS
  Administered 2021-03-31: 19:00:00 40 via INTRAVENOUS
  Administered 2021-03-31: 19:00:00 20 via INTRAVENOUS
  Administered 2021-03-31: 19:00:00 30 via INTRAVENOUS

## 2021-03-31 MED ORDER — SODIUM CHLORIDE 0.9 % IV SOLN
0.9 % | INTRAVENOUS | Status: DC | PRN
Start: 2021-03-31 — End: 2021-03-31

## 2021-03-31 MED FILL — DIPRIVAN 200 MG/20ML IV EMUL: 200 MG/20ML | INTRAVENOUS | Qty: 20

## 2021-03-31 MED FILL — BD POSIFLUSH 0.9 % IV SOLN: 0.9 % | INTRAVENOUS | Qty: 40

## 2021-03-31 NOTE — Op Note (Signed)
[facility]  OPERATIVE REPORT    Name:  Shannon Marshall, Shannon Marshall  DOB:  1970-06-03  ACCOUNT #:  [id number]  DATE OF SERVICE:  03/31/2021    PREPROCEDURE DIAGNOSIS:  Screening colonoscopy.    POSTOPERATIVE DIAGNOSIS:  Screening colonoscopy.    PROCEDURE PERFORMED:  Colonoscopy.    SURGEON:  Gwenlyn Found, MD    INDICATIONS: This is a 51 year old female here for colonic screening.    PROCEDURE:  The patient was brought to the GI suite and given monitored anesthesia care.  With a moderate degree of difficulty, we were able to reach the cecum.  The findings were as follows:  The prep was good.  The cecum was normal.  The ascending colon was normal.  The transverse colon was normal.  The descending colon was normal.  The sigmoid colon was redundant, but otherwise normal.  The rectum was normal.  The patient tolerated everything well.    SPECIMENS REMOVED:  None.    ESTIMATED BLOOD LOSS:  No blood loss.    We will recommend followup colonoscopy in 10 years with use of an adult colonoscope.        Gwenlyn Found, MD      [id number]  D:  03/31/2021 14:33  T:  04/01/2021 1:57  JOB #:  [id number]

## 2021-03-31 NOTE — H&P (Signed)
[facility]    Primary Care Provider: None None   Referring Physician:       Dear Dr. Bonnetta Barry ref. provider found :    I had the pleasure of seeing your patient, Shannon Marshall in the office today at [facility]. I am attaching the following encounter for your review as well as the current assessment and plan.    I would like to thank you for the referral and allowing me to participate in the healthcare of this patient.    Please, do not hesitate to contact me for any questions regarding her   Care.    Best Regards,           ASSESSMENT/PLAN:plan for colonoscopy            SUBJECTIVE/OBJECTIVE:here for colonic screening      Past Medical History:   Diagnosis Date    Anxiety     Blood type, Rh negative     Hypertension     Seasonal allergies     Sleep apnea      Past Surgical History:   Procedure Laterality Date    BREAST BIOPSY Right 2015    CESAREAN SECTION N/A     2008 2010 2013    DILATION AND CURETTAGE N/A 1997    SINUS SURGERY      WISDOM TOOTH EXTRACTION       Prior to Admission medications    Medication Sig Start Date End Date Taking? Authorizing Provider   cetirizine (ZYRTEC) 10 MG tablet Take 10 mg by mouth every morning   Yes Historical Provider, MD   ibuprofen (ADVIL;MOTRIN) 600 MG tablet Take 1 tablet by mouth every 6 hours as needed for Pain  Patient taking differently: Take 600 mg by mouth every 6 hours as needed for Pain Hold per protocol 02/10/21 02/15/21  Vernelle Emerald, APRN - NP   medroxyPROGESTERone (PROVERA) 10 MG tablet Take 1 tablet by mouth daily for 21 days Take monthly for 7 days if no menstrual cycle spontaneously  Patient taking differently: Take 10 mg by mouth every morning Take monthly for 7 days if no menstrual cycle spontaneously 02/03/21 02/24/21  Judithe Modest, MD   fluticasone (FLONASE) 50 MCG/ACT nasal spray 2 sprays by Nasal route as needed for Allergies    Historical Provider, MD   lisinopril (PRINIVIL;ZESTRIL)  5 MG tablet Take 5 mg by mouth every morning Hold DOS per protocol    [facility] Automatic Reconciliation, MD     Allergies   Allergen Reactions    Penicillins Anaphylaxis and Swelling            Social History     Socioeconomic History    Marital status: Divorced     Spouse name: Not on file    Number of children: Not on file    Years of education: Not on file    Highest education level: Not on file   Occupational History    Not on file   Tobacco Use    Smoking status: Never    Smokeless tobacco: Never   Vaping Use    Vaping Use: Never used   Substance and Sexual Activity    Alcohol use: Never    Drug use: Never    Sexual activity: Not on file   Other Topics Concern    Not on file   Social History Narrative    Not on file     Social Determinants  of Health     Financial Resource Strain: Not on file   Food Insecurity: Not on file   Transportation Needs: Not on file   Physical Activity: Not on file   Stress: Not on file   Social Connections: Not on file   Intimate Partner Violence: Not on file   Housing Stability: Not on file     Family History   Problem Relation Age of Onset    Heart Attack Father     Diabetes Maternal Grandmother     Stroke Maternal Grandmother     Diabetes Paternal Grandmother     Heart Disease Paternal Grandmother     Breast Cancer Paternal Aunt      Cancer-related family history includes Breast Cancer in her paternal aunt.     General/Constitutional          Fever denies.  Denies Chills.  Denies Fatigue.  Weight change denies.         Allergy/Immunology          Denies Congestion.  Denies Cough.  Denies Sneezing.         Ophthalmologic          Denies Blurred vision.         Endocrine          Denies Cold intolerance.  Denies Excessive sweating.  Denies Excessive thirst.  Denies Heat intolerance.         Respiratory          Shortness of breath  denies.         Cardiovascular          Chest pain  denies.  Heart problems  denies.  Circulatory problems  denies.  Chest pain while exerting yourself   Denies. Irregular heartbeat  Denies .Palpitations  Denies .         Gastrointestinal          Dark Tarry Stool denies.  Blood in stool Denies.  Change in bowel habits  Denies .  Constipation denies.  Decreased appetite  Denies .  Diarrhea denies.  Nausea  Denies .     Rectal bleeding  Denies.  Vomiting  Denies.        Hematology          AIDS/HIV  denies.  Excessive bleeding after surgery or medical procedure  denies.         Genitourinary          Denies Difficulty urinating.         Neurologic          Denies Balance difficulty.  Denies Coordination trouble.  Denies Dizziness.  Denies Fainting.  Denies Gait abnormality.      Colorectal  Loss of bowel/bladder control  Denies.    Accidental loss or leakage of stool-sometimes doesn't make it to the bathroom in time. Denies.    Bowel accidents-no warning, when passing gas or while asleep  Denies.    Frequent, loose, watery stool  Denies.    Sudden or strong urge to go to the bathroom  Denies.           Objective   There were no vitals filed for this visit.     Physical Exam  Constitutional:       Appearance: Normal appearance.   HENT:      Head: Normocephalic.   Cardiovascular:      Rate and Rhythm: Normal rate.   Pulmonary:      Effort: Pulmonary effort  is normal.   Abdominal:      General: Abdomen is flat.      Palpations: Abdomen is soft.   Genitourinary:     Rectum: Normal.   Musculoskeletal:         General: Normal range of motion.      Cervical back: Normal range of motion.   Skin:     General: Skin is warm and dry.   Neurological:      General: No focal deficit present.      Mental Status: She is alert.   Psychiatric:         Mood and Affect: Mood normal.         Thought Content: Thought content normal.         Judgment: Judgment normal.              An electronic signature was used to authenticate this note.    --Gwenlyn Found, MD

## 2021-03-31 NOTE — Anesthesia Post-Procedure Evaluation (Signed)
Department of Anesthesiology  Postprocedure Note    Patient: Shannon Marshall  MRN: [id number]  Birthdate: 09-21-1970  Date of evaluation: 04/01/2021      Procedure Summary     Date: 03/31/21 Room / Location: [facility] / [facility]    Anesthesia Start: 1343 Anesthesia Stop: 1427    Procedure: COLONOSCOPY DIAGNOSTIC (Rectum) Diagnosis:       Colon cancer screening      (Colon cancer screening [Z12.11])    Surgeons: Gwenlyn Found, MD Responsible Provider: Tyler Pita, MD    Anesthesia Type: General ASA Status: 3          Anesthesia Type: General    Aldrete Phase I:      Aldrete Phase II: Aldrete Score: 10      Anesthesia Post Evaluation    Patient location during evaluation: PACU  Patient participation: complete - patient participated  Level of consciousness: awake and alert  Airway patency: patent  Nausea & Vomiting: no nausea and no vomiting  Complications: no  Cardiovascular status: blood pressure returned to baseline  Respiratory status: acceptable  Hydration status: euvolemic  Comments: Vitals Reviewed

## 2021-03-31 NOTE — Discharge Instructions (Signed)
-  If you are unable to void by 10pm, or at any time feel the urge to void but are unable to, go to the nearest ER or urgent care.     -Nausea and vomiting are unfortunate, but common side effects after anesthesia. If you are experiencing excessive nausea or vomiting, call your doctor.     You received anesthesia today. For the next 24 hours:  -No driving or operating any heavy machinery.  -Do not make any legal decisions or important life decisions.  -Do not drink alcohol or take sleeping medications.

## 2021-03-31 NOTE — Anesthesia Pre-Procedure Evaluation (Signed)
Department of Anesthesiology  Preprocedure Note       Name:  Shannon Marshall   Age:  51 y.o.  DOB:  1970/03/10                                          MRN:  [id number]         Date:  03/31/2021      Surgeon: Moishe Spice):  Gwenlyn Found, MD    Procedure: Procedure(s):  COLONOSCOPY DIAGNOSTIC    Medications prior to admission:   Prior to Admission medications    Medication Sig Start Date End Date Taking? Authorizing Provider   cetirizine (ZYRTEC) 10 MG tablet Take 10 mg by mouth every morning   Yes Historical Provider, MD   ibuprofen (ADVIL;MOTRIN) 600 MG tablet Take 1 tablet by mouth every 6 hours as needed for Pain  Patient taking differently: Take 600 mg by mouth every 6 hours as needed for Pain Hold per protocol 02/10/21 02/15/21  Vernelle Emerald, APRN - NP   medroxyPROGESTERone (PROVERA) 10 MG tablet Take 1 tablet by mouth daily for 21 days Take monthly for 7 days if no menstrual cycle spontaneously  Patient taking differently: Take 10 mg by mouth every morning Take monthly for 7 days if no menstrual cycle spontaneously 02/03/21 02/24/21  Judithe Modest, MD   fluticasone (FLONASE) 50 MCG/ACT nasal spray 2 sprays by Nasal route as needed for Allergies    Historical Provider, MD   lisinopril (PRINIVIL;ZESTRIL) 5 MG tablet Take 5 mg by mouth every morning Hold DOS per protocol    [facility] Automatic Reconciliation, MD       Current medications:    Current Facility-Administered Medications   Medication Dose Route Frequency Provider Last Rate Last Admin   ??? sodium chloride flush 0.9 % injection 5-40 mL  5-40 mL IntraVENous 2 times per day Clide Dales, APRN - NP       ??? sodium chloride flush 0.9 % injection 5-40 mL  5-40 mL IntraVENous PRN Clide Dales, APRN - NP       ??? 0.9 % sodium chloride infusion  25 mL IntraVENous PRN Clide Dales, APRN - NP       ??? lactated ringers IV soln infusion   IntraVENous Continuous Gwenlyn Found, MD 100 mL/hr at 03/31/21 1016 New Bag at 03/31/21 1016        Allergies:    Allergies   Allergen Reactions   ??? Penicillins Anaphylaxis and Swelling              Problem List:    Patient Active Problem List   Diagnosis Code   ??? Menopausal and female climacteric states N95.1   ??? Metrorrhagia N92.1   ??? 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 Urinary hesitancy R39.11   ??? Irregular menses N92.6       Past Medical History:        Diagnosis Date   ??? Anxiety    ??? Blood type, Rh negative    ??? Hypertension    ??? Seasonal allergies    ??? Sleep apnea        Past Surgical History:        Procedure Laterality Date   ??? BREAST BIOPSY Right 2015   ??? CESAREAN SECTION N/A     2008 2010 2013   ???  DILATION AND CURETTAGE N/A 1997   ??? SINUS SURGERY     ??? WISDOM TOOTH EXTRACTION         Social History:    Social History     Tobacco Use   ??? Smoking status: Never   ??? Smokeless tobacco: Never   Substance Use Topics   ??? Alcohol use: Never                                Counseling given: Not Answered      Vital Signs (Current):   Vitals:    03/02/21 0827 03/31/21 0905   BP:  (!) 141/64   Pulse:  91   Resp:  20   Temp:  97.9 ??F (36.6 ??C)   TempSrc:  Oral   SpO2:  98%   Weight: 205 lb (93 kg) 218 lb 4.1 oz (99 kg)   Height: 5\' 6"  (1.676 m) 5\' 6"  (1.676 m)                                              BP Readings from Last 3 Encounters:   03/31/21 (!) 141/64   02/10/21 132/82   02/03/21 136/75       NPO Status: Time of last liquid consumption: 2230                        Time of last solid consumption: 1900                        Date of last liquid consumption: 03/30/21                        Date of last solid food consumption: 03/29/21    BMI:   Wt Readings from Last 3 Encounters:   03/31/21 218 lb 4.1 oz (99 kg)   02/10/21 234 lb (106.1 kg)   02/03/21 234 lb (106.1 kg)     Body mass index is 35.23 kg/m??.    CBC:   Lab Results   Component Value Date/Time    WBC 11.6 05/04/2020 12:53 PM    RBC 4.38 05/04/2020 12:53 PM    HGB 13.4 05/04/2020 12:53 PM    HCT 39.1 05/04/2020 12:53 PM    MCV  89.3 05/04/2020 12:53 PM    RDW 13.1 05/04/2020 12:53 PM    PLT 306 05/04/2020 12:53 PM       CMP: No results found for: NA, K, CL, CO2, BUN, CREATININE, GFRAA, AGRATIO, LABGLOM, GLUCOSE, GLU, PROT, CALCIUM, BILITOT, ALKPHOS, AST, ALT    POC Tests: No results for input(s): POCGLU, POCNA, POCK, POCCL, POCBUN, POCHEMO, POCHCT in the last 72 hours.    Coags: No results found for: PROTIME, INR, APTT    HCG (If Applicable):   Lab Results   Component Value Date    PREGTESTUR Negative 03/31/2021        ABGs: No results found for: PHART, PO2ART, PCO2ART, HCO3ART, BEART, O2SATART     Type & Screen (If Applicable):  No results found for: LABABO, LABRH    Drug/Infectious Status (If Applicable):  No results found for: HIV, HEPCAB    COVID-19 Screening (If Applicable):   Lab Results   Component Value Date/Time  COVID19 Detected 02/10/2021 04:23 PM           Anesthesia Evaluation  Patient summary reviewed and Nursing notes reviewed  Airway: Mallampati: III  TM distance: >3 FB   Neck ROM: full  Mouth opening: > = 3 FB   Dental: normal exam         Pulmonary:normal exam    (+) sleep apnea: on CPAP,                             Cardiovascular:    (+) hypertension:,                   Neuro/Psych:   Negative Neuro/Psych ROS              GI/Hepatic/Renal:             Endo/Other: Negative Endo/Other ROS                    Abdominal:             Vascular: negative vascular ROS.         Other Findings:           Anesthesia Plan      general and TIVA     ASA 3       Induction: intravenous.      Anesthetic plan and risks discussed with patient.      Plan discussed with CRNA.                    Tyler Pita, MD   03/31/2021

## 2021-05-31 ENCOUNTER — Encounter
Admit: 2021-05-31 | Discharge: 2021-05-31 | Payer: MEDICAID | Attending: Student in an Organized Health Care Education/Training Program

## 2021-05-31 ENCOUNTER — Emergency Department: Admit: 2021-05-31 | Payer: MEDICAID

## 2021-05-31 ENCOUNTER — Inpatient Hospital Stay
Admit: 2021-05-31 | Discharge: 2021-05-31 | Disposition: A | Discharge status: Home / Self Care | Payer: MEDICAID | Attending: Student in an Organized Health Care Education/Training Program

## 2021-05-31 DIAGNOSIS — Z7689 Persons encountering health services in other specified circumstances: Secondary | ICD-10-CM

## 2021-05-31 DIAGNOSIS — R079 Chest pain, unspecified: Secondary | ICD-10-CM

## 2021-05-31 LAB — TROPONIN
Troponin T: <0.01 ng/mL (ref 0.000–0.010)
Troponin T: <0.01 ng/mL (ref 0.000–0.010)

## 2021-05-31 LAB — CBC
Hematocrit: 39 % (ref 34.0–47.0)
Hemoglobin: 13.3 g/dL (ref 11.5–15.7)
MCH: 30.6 pg (ref 27.0–34.5)
MCHC: 34.1 g/dL (ref 32.0–36.0)
MCV: 89.7 fL (ref 81.0–99.0)
MPV: 9.3 fL (ref 7.2–13.2)
Platelets: 227 10*3/uL (ref 140–440)
RBC: 4.35 x10e6/mcL (ref 3.60–5.20)
RDW: 12.4 % (ref 11.0–16.0)
WBC: 9.7 10*3/uL (ref 3.8–10.6)

## 2021-05-31 LAB — COMPREHENSIVE METABOLIC PANEL
ALT: 18 U/L (ref 0–35)
AST: 18 U/L (ref 0–35)
Albumin/Globulin Ratio: 1 (ref 1.00–2.70)
Albumin: 3.9 g/dL (ref 3.5–5.2)
Alk Phosphatase: 84 U/L (ref 35–117)
Anion Gap: 12 mmol/L (ref 2–17)
BUN: 13 mg/dL (ref 6–20)
CO2: 24 mmol/L (ref 22–29)
Calcium: 9.1 mg/dL (ref 8.6–10.0)
Chloride: 104 mmol/L (ref 98–107)
Creatinine: 0.7 mg/dL (ref 0.5–1.0)
Est, Glom Filt Rate: 106 mL/min/1.73m (ref >=60)
Globulin: 3 g/dL (ref 1.9–4.4)
Glucose: 106 mg/dL — ABNORMAL HIGH (ref 70–99)
OSMOLALITY CALCULATED: 280 mOsm/kg (ref 270–287)
Potassium: 4.1 mmol/L (ref 3.5–5.3)
Sodium: 140 mmol/L (ref 135–145)
Total Bilirubin: 0.21 mg/dL (ref 0.00–1.20)
Total Protein: 6.8 g/dL (ref 6.4–8.3)

## 2021-05-31 LAB — EKG 12-LEAD
P Axis: 46 degrees
P-R Interval: 144 ms
Q-T Interval: 370 ms
QRS Duration: 105 ms
QTc Calculation (Bazett): 439 ms
R Axis: -7 degrees
T Axis: 33 degrees
Ventricular Rate: 84 {beats}/min

## 2021-05-31 LAB — AMB POC GLUCOSE BLOOD, BY GLUCOSE MONITORING DEVICE: Glucose, POC: 136 mg/dL

## 2021-05-31 LAB — TSH WITH REFLEX: TSH: 2.02 mcIU/mL (ref 0.358–3.740)

## 2021-05-31 LAB — AMB POC HEMOGLOBIN A1C: Hemoglobin A1C, POC: 5.3 %

## 2021-05-31 LAB — ADD ON LAB TEST

## 2021-05-31 MED ORDER — LISINOPRIL 5 MG PO TABS
5 MG | ORAL_TABLET | Freq: Every morning | ORAL | 1 refills | Status: AC
Start: 2021-05-31 — End: 2022-02-15

## 2021-05-31 MED ORDER — ESTRADIOL 10 MCG VA TABS
10 MCG | ORAL_TABLET | VAGINAL | 0 refills | Status: DC
Start: 2021-05-31 — End: 2021-06-28

## 2021-05-31 MED ORDER — ESCITALOPRAM OXALATE 10 MG PO TABS
10 MG | ORAL_TABLET | Freq: Every day | ORAL | 3 refills | Status: DC
Start: 2021-05-31 — End: 2021-07-28

## 2021-05-31 MED ORDER — ESTRADIOL 10 MCG VA TABS
10 MCG | ORAL_TABLET | VAGINAL | 3 refills | Status: DC
Start: 2021-05-31 — End: 2021-10-05

## 2021-05-31 MED ORDER — OMEPRAZOLE 20 MG PO CPDR
20 MG | ORAL_CAPSULE | Freq: Every day | ORAL | 5 refills | Status: AC
Start: 2021-05-31 — End: 2021-07-28

## 2021-05-31 NOTE — Discharge Instructions (Signed)
Please call cardiology at the number listed on your discharge instructions to schedule an appointment as soon as possible.    It is essential that you follow-up with a cardiologist for further evaluation and testing.    Additionally, I would like you to follow-up with your primary care physician.    Please return to the emergency department if you have chest pain, shortness of breath, vomiting, lightheadedness, increased work of breathing or any other concerning symptoms.

## 2021-05-31 NOTE — Progress Notes (Signed)
Date of Service: 05/30/2021  Patient: Shannon Marshall  DOB: 1970-11-07  Shannon Marshall a 51 y.o. female presents today for Comprehensive Annual Physical Exam     Current Health Issues:  Pt presents to est. Care.   -Pt is divorced with 3 daughters, one is severely disabled and 73 yrs old. The others are 9 and 13. Pt has a friend who helps out and a case manager.   -Last Pap with HPV 02/03/21-neg, due in 5 years. GYN with [facility], Dr. Rivka Safer  Mammogram-January 2023  -colonoscopy-At [facility] in January 2023, due every 10 years. No EGD.   Eye-appt made next month, [facility]. Has been several years since last exam.   -OSA, PND present  HTN:  Pt has a history of hypertension  Pt is  compliant with taking medication.  Pt's antihypertensive medication listed in med list.   Pt is advised to have eye exams yearly and screening for microalbuminuria at least once a year.  Pt admits to CP yesterday, SOB, pos for DOE, pos for headaches, vision changes, and peripheral edema.  Home monitoring of blood pressure results: does not monitor  Pt states she had chest pain yesterday described as mid chest "hard, burning, referral down right side of body with indigestion, nausea, diarrhea, lightheadedness. Pt drank water and took Tums that helped with the chest pain but not the belching or N/D. Pt had lower GI cramping. EKG in office shows possible anterior infarct. Pt's father died of a massive heart attack. POC Random glucose 136, A1c 5.3.     Perimenopause:  Pt with LMP ending March 09, 2021. Symptoms of neuropathy in feet, tingling in head, night time wakening, anxiety, weight gain, cold episodes, vaginal burning, tinnitus  Pt has taken Lexapro in years past. It was effective for anxiety.    No results found for: NA, K, CL, CO2    No results found for: CREATININE, BUN, NA, K, CL, CO2   Anxiety or Depression   Pt has a history of anxiety and/or depression.  Review with pt relaxation techniques and sleep hygiene and discuss  benefits of therapy with CBT.  Medications (if prescribed) are updated and reviewed on current med list.  Pt Denies any SI or HI.  Pt is  seeing a therapist. Shannon Marshall at [facility] on [address]. Next appt tomorrow.   Pt is not seeing a psychiatrist.  Pt does have sleep disturbance.  Pt indicates current stressors to ZO:[id number] handicapped daughter  Degree of Functional Impairment: severe       GAD-7 SCREENING 05/31/2021   Feeling nervous, anxious, or on edge Nearly every day   Not being able to stop or control worrying Nearly every day   Worrying too much about different things Nearly every day   Trouble relaxing Nearly every day   Being so restless that it is hard to sit still Not at all   Becoming easily annoyed or irritable Not at all   Feeling afraid as if something awful might happen Nearly every day   GAD-7 Total Score 15   How difficult have these problems made it for you to do your work, take care of things at home, or get along with other people? Somewhat difficult        Preventive Health Screening:  Health Maintenance   Topic Date Due    HIV screen  Never done    Hepatitis C screen  Never done    Diabetes screen  Never done  Lipids  Never done    COVID-19 Vaccine (2 - Moderna series) 11/13/2019    Flu vaccine (1) 10/04/2020    Depression Screen  02/03/2022    Cervical cancer screen  02/03/2026    DTaP/Tdap/Td vaccine (2 - Td or Tdap) 12/07/2029    Colorectal Cancer Screen  04/01/2031    Hepatitis A vaccine  Aged Out    Hib vaccine  Aged Out    Meningococcal (ACWY) vaccine  Aged Out    Pneumococcal 0-64 years Vaccine  Aged Out         There is no immunization history on file for this patient.        Other Specialists Shannon Marshall currently sees:  [facility]  [facility]  [facility], therapist Shannon Marshall for PTSD      Current Outpatient Medications   Medication Sig Dispense Refill    cetirizine (ZYRTEC) 10 MG tablet Take 10 mg by mouth every morning       ibuprofen (ADVIL;MOTRIN) 600 MG tablet Take 1 tablet by mouth every 6 hours as needed for Pain (Patient taking differently: Take 600 mg by mouth every 6 hours as needed for Pain Hold per protocol) 20 tablet 0    medroxyPROGESTERone (PROVERA) 10 MG tablet Take 1 tablet by mouth daily for 21 days Take monthly for 7 days if no menstrual cycle spontaneously (Patient taking differently: Take 10 mg by mouth every morning Take monthly for 7 days if no menstrual cycle spontaneously) 21 tablet 3    fluticasone (FLONASE) 50 MCG/ACT nasal spray 2 sprays by Nasal route as needed for Allergies      lisinopril (PRINIVIL;ZESTRIL) 5 MG tablet Take 5 mg by mouth every morning Hold DOS per protocol       No current facility-administered medications for this visit.       Allergies   Allergen Reactions    Penicillins Anaphylaxis and Swelling            Patient Active Problem List   Diagnosis    Menopausal and female climacteric states    Metrorrhagia    Mixed stress and urge urinary incontinence    Urinary hesitancy    Irregular menses     Past Medical History:   Diagnosis Date    Anxiety     Blood type, Rh negative     Hypertension     Seasonal allergies     Sleep apnea      Past Surgical History:   Procedure Laterality Date    BREAST BIOPSY Right 2015    CESAREAN SECTION N/A     2008 2010 2013    COLONOSCOPY N/A 03/31/2021    COLONOSCOPY DIAGNOSTIC performed by Gwenlyn Found, MD at [facility]    DILATION AND CURETTAGE N/A 1997    SINUS SURGERY      WISDOM TOOTH EXTRACTION       Family History   Problem Relation Age of Onset    Heart Attack Father     Diabetes Maternal Grandmother     Stroke Maternal Grandmother     Diabetes Paternal Grandmother     Heart Disease Paternal Grandmother     Breast Cancer Paternal Aunt      Social History     Tobacco Use    Smoking status: Never    Smokeless tobacco: Never   Substance Use Topics    Alcohol use: Never     Social History     Social History Narrative  Not on file         ROS:    Review  of Systems    See HPI, All other ROS are negative       BP 122/76    Pulse 85    Temp 97.7 ??F (36.5 ??C)    Ht 5' 6.25" (1.683 m)    Wt 231 lb (104.8 kg)    SpO2 98%    BMI 37.00 kg/m??     General: conversive, pleasant, awake, alert   HEENT: TM pearly grey and intact, PERRLA, conjunctiva normal, Neck supple, no lymphadenopathy, mucus membranes moist, no thyromegaly  Respiratory: breath sounds clear throughout, non-labored breathing  Cardiovascular: RRR, no MGR, no carotid bruits, radial and pedal pulses, no edema  EKG: rate 78, poor R-wave progression, may be secondary to pulmonary disease, consider old anterior infarct  A1c 5.3  Random glucose 136  GI: non-distended, non-tender, soft, no liver edge, no masses  GU: no suprapubic tenderness  Neuro: A/O x4, speech normal, CN II-XII intact, no focal motor or sensory deficits  MSK: Full ROM in BUE and BLE  Skin: no rashes, warm, dry  Psychiatric: cooperative  Reproductive: deferred       ASSESSMENT and PLAN:  1. Establishing care with new doctor, encounter for  - CBC with Auto Differential; Future  - Comprehensive Metabolic Panel; Future    2. Screening for diabetes mellitus  - AMB POC HEMOGLOBIN A1C  - POC Glucose Blood Test ([id number])    3. Screening for cholesterol level  - Lipid Panel; Future    4. Screening for thyroid disorder  - TSH with Reflex; Future    5. Essential hypertension  - lisinopril (PRINIVIL;ZESTRIL) 5 MG tablet; Take 1 tablet by mouth every morning Hold DOS per protocol  Dispense: 90 tablet; Refill: 1  - Microalbumin, Ur; Future  Rolla Etienne- Gunn, Jason, MD -  Pulmonology  - Troponin; Future  - Amb External Referral To Cardiology    6. Other chest pain  - EKG 12 Lead  - AMB POC HEMOGLOBIN A1C  - POC Glucose Blood Test ([id number])  - Troponin; Future  - Amb External Referral To Cardiology-stress test, possible old anterior infarct per EKG, current symptoms  -Discussed need for patient to go to ER today to further investigate dizziness, chest pain, nausea vomiting  diarrhea for possible relationship to abnormal EKG and heart.  Patient offered to have ambulance pick her up but she declined preferring to drive herself.  [facility] Sent patient with copy of EKG.     7. Need for COVID-19 vaccine  - COVID-19, MODERNA Bivalent BOOSTER, (age 12y+), IM, 50 mcg/0.5 mL    8. PTSD (post-traumatic stress disorder)    9. Gastroesophageal reflux disease, unspecified whether esophagitis present  -omeprazole 20 mg to be taken by mouth every morning on an empty stomach 1 hour before eating and 2 hours before other medications.   10. Belching  - AMB POC HEMOGLOBIN A1C  - POC Glucose Blood Test ([id number])  - Troponin; Future  - Amb External Referral To Cardiology    11. Flatulence  - AMB POC HEMOGLOBIN A1C  - POC Glucose Blood Test ([id number])  - Amb External Referral To Cardiology    12. Dizziness  - AMB POC HEMOGLOBIN A1C  - POC Glucose Blood Test ([id number])  - Troponin; Future  - Amb External Referral To Cardiology    13. Other fatigue  - Vitamin D 25  Hydroxy; Future  - Vitamin B12; Future  - Amb External Referral To Cardiology    14. OSA (obstructive sleep apnea)  Rolla Etienne, MD -  Pulmonology  - Amb External Referral To Cardiology-stress test, possible old anterior infarct per EKG, current symptoms    15. PND (paroxysmal nocturnal dyspnea)  - Rolla Etienne, MD -  Pulmonology    16. Obesity (BMI 35.0-39.9 without comorbidity)  Rolla Etienne, MD -  Pulmonology  - Amb External Referral To Cardiology    17. Perimenopause  start- escitalopram (LEXAPRO) 10 MG tablet; Take 1 tablet by mouth daily  Dispense: 30 tablet; Refill: 3  start- Estradiol (VAGIFEM) 10 MCG TABS vaginal tablet; Starting dosing: Insert 1 tablet intravaginally daily for 2 weeks, followed by twice weekly.  Dispense: 18 tablet; Refill: 0  start- Estradiol (VAGIFEM) 10 MCG TABS vaginal tablet; Insert 1 tablet intravaginally twice weekly  Dispense: 24 tablet; Refill: 3   No orders of  the defined types were placed in this encounter.  Please have 8 hour fasting labs done at a [facility].   Return in about 2 weeks (around 06/14/2021) for GERD, chest pain, f/u from ED visit, perimenopause with new meds.

## 2021-05-31 NOTE — ED Provider Notes (Signed)
[facility] EMER[facility] DEPT  EMERGENCY DEPARTMENT ENCOUNTER      Pt Name: Shannon Marshall  MRN: [id number]  Birthdate 31-Dec-1970  Date of evaluation: 05/31/2021  Provider: Joselyn Glassman, MD    CHIEF COMPLAINT       Chief Complaint   Patient presents with    Chest Pain     C/o CP NVD x a few weeks, seen at PCP and was told she had an MI at some point. Pt has had some major life changes with a divorce and disabled child. Pt is tearful and anxious during triage.          HISTORY OF PRESENT ILLNESS    Shannon Marshall is a pleasant 51 y.o. female who presents to the emergency department with chest pain that occurred yesterday and has since resolved.      Patient was sent in by primary care physician an EKG that demonstrated poor R wave progression that could represent an old infarct.  Patient currently does not have any chest pain.  Patient had an episode of chest pain yesterday that was burning in nature.  No diaphoresis.  No nausea or vomiting.  No shortness of breath.  Patient is hemodynamically stable and afebrile.  Patient does endorse a 15 pound weight gain since the beginning of this year.        Nursing Notes were reviewed.    REVIEW OF SYSTEMS       Review of Systems   Constitutional:  Negative for fever.   Respiratory:  Negative for shortness of breath.    Cardiovascular:  Negative for palpitations.        Patient currently has no chest pain today.  Patient did have pain episode of chest pain yesterday.   Gastrointestinal:  Negative for abdominal pain, nausea and vomiting.   Genitourinary:  Negative for dysuria and flank pain.   Musculoskeletal:  Negative for back pain.   Skin:  Negative for rash.   Neurological:  Negative for dizziness, weakness, light-headedness, numbness and headaches.   Psychiatric/Behavioral:  Negative for agitation and behavioral problems. The patient is nervous/anxious.         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  Patient states she has been  more anxious and stressed than normal.   All other systems reviewed and are negative.    Except as noted above the remainder of the review of systems was reviewed and negative.       PAST MEDICAL HISTORY     Past Medical History:   Diagnosis Date    Anxiety     Blood type, Rh negative     Hypertension     Seasonal allergies     Sleep apnea          SURGICAL HISTORY       Past Surgical History:   Procedure Laterality Date    BREAST BIOPSY Right 2015    CESAREAN SECTION N/A     2008 2010 2013    COLONOSCOPY N/A 03/31/2021    COLONOSCOPY DIAGNOSTIC performed by Gwenlyn Found, MD at [facility] MAIN OR    DILATION AND CURETTAGE N/A 1997    SINUS SURGERY      WISDOM TOOTH EXTRACTION           CURRENT MEDICATIONS       Discharge Medication List as of 05/31/2021  5:12 PM        CONTINUE these medications which have NOT CHANGED  Details   Multiple Vitamins-Minerals (MULTIVITAMIN ADULTS PO) Take by mouthHistorical Med      Fexofenadine HCl (ALLEGRA ALLERGY PO) Take by mouthHistorical Med      lisinopril (PRINIVIL;ZESTRIL) 5 MG tablet Take 1 tablet by mouth every morning Hold DOS per protocol, Disp-90 tablet, R-1Normal      escitalopram (LEXAPRO) 10 MG tablet Take 1 tablet by mouth daily, Disp-30 tablet, R-3Normal      !! Estradiol (VAGIFEM) 10 MCG TABS vaginal tablet Starting dosing: Insert 1 tablet intravaginally daily for 2 weeks, followed by twice weekly., Disp-18 tablet, R-0Normal      !! Estradiol (VAGIFEM) 10 MCG TABS vaginal tablet Insert 1 tablet intravaginally twice weekly, Disp-24 tablet, R-3Normal      omeprazole (PRILOSEC) 20 MG delayed release capsule Take 1 capsule by mouth every morning (before breakfast), Disp-30 capsule, R-5Normal      ibuprofen (ADVIL;MOTRIN) 600 MG tablet Take 1 tablet by mouth every 6 hours as needed for Pain, Disp-20 tablet, R-0Normal      fluticasone (FLONASE) 50 MCG/ACT nasal spray 2 sprays by Nasal route as needed for AllergiesHistorical Med       !! - Potential duplicate medications  found. Please discuss with provider.          ALLERGIES     Penicillins    FAMILY HISTORY       Family History   Problem Relation Age of Onset    High Cholesterol Father     High Blood Pressure Father     Coronary Art Dis Father     Heart Attack Father     Alcohol Abuse Father     High Blood Pressure Brother     Breast Cancer Paternal Aunt     High Cholesterol Maternal Grandmother     High Blood Pressure Maternal Grandmother     Diabetes Maternal Grandmother     Stroke Maternal Grandmother     Diabetes Paternal Grandmother     Heart Disease Paternal Grandmother           SOCIAL HISTORY       Social History     Socioeconomic History    Marital status: Divorced     Spouse name: None    Number of children: None    Years of education: None    Highest education level: None   Tobacco Use    Smoking status: Never    Smokeless tobacco: Never   Vaping Use    Vaping Use: Never used   Substance and Sexual Activity    Alcohol use: Never    Drug use: Never       SCREENINGS                               CIWA Assessment  BP: 135/71  Heart Rate: 76                 PHYSICAL EXAM       ED Triage Vitals [05/31/21 1220]   BP Temp Temp src Heart Rate Resp SpO2 Height Weight   (!) 216/136 98.5 ??F (36.9 ??C) -- (!) 101 16 100 %  (1.676 m) 231 lb 0.7 oz (104.8 kg)       Physical Exam  Vitals and nursing note reviewed.   Constitutional:       General: She is not in acute distress.  HENT:      Head: Normocephalic and atraumatic.  Nose: No congestion or rhinorrhea.   Cardiovascular:      Rate and Rhythm: Normal rate and regular rhythm.      Pulses: Normal pulses.   Pulmonary:      Effort: Pulmonary effort is normal. No respiratory distress.      Breath sounds: No stridor. No wheezing, rhonchi or rales.   Abdominal:      General: There is no distension.      Tenderness: There is no abdominal tenderness. There is no guarding or rebound.   Musculoskeletal:         General: Normal range of motion.      Cervical back: Normal range of  motion and neck supple.      Right lower leg: No edema.      Left lower leg: No edema.   Skin:     General: Skin is warm and dry.   Neurological:      General: No focal deficit present.      Mental Status: She is alert. Mental status is at baseline.   Psychiatric:         Behavior: Behavior normal.      Comments: Patiently mildly anxious on exam.       DIAGNOSTIC RESULTS     EKG: All EKG's are interpreted by the Emergency Department Physician who either signs or Co-signs this chart in the absence of a cardiologist.      RADIOLOGY:   Non-plain film images such as CT, Ultrasound and MRI are read by the radiologist. Plain radiographic images are visualized and preliminarily interpreted by the emergency physician.    Interpretation per the Radiologist below, if available at the time of this note:    XR CHEST (2 VW)   Final Result   Normal PA and lateral chest.            ED BEDSIDE ULTRASOUND:   Performed by ED Physician - none    LABS:  Labs Reviewed   COMPREHENSIVE METABOLIC PANEL - Abnormal; Notable for the following components:       Result Value    Glucose 106 (*)     All other components within normal limits   CBC   TROPONIN   TROPONIN   ADD ON LAB TEST   TSH WITH REFLEX       All other labs were within normal range or not returned as of this dictation.    EMERGENCY DEPARTMENT COURSE and DIFFERENTIAL DIAGNOSIS/MDM:   Vitals:    Vitals:    05/31/21 1220 05/31/21 1500 05/31/21 1715   BP: (!) 216/136 130/81 135/71   Pulse: (!) 101  76   Resp: 16  18   Temp: 98.5 ??F (36.9 ??C)     SpO2: 100%     Weight: 104.8 kg     Height: 5\' 6"  (1.676 m)           Medical Decision Making  Differential Diagnosis includes but is not limited to:  ACS  MSK pain  Abnormal EKG in outpatient setting- EKG does not show any acute signs of ischemia.  No STEMI.  Thyroid dysfunction  Indigestion/GERD    Data reviewed / Counseling:  I have personally reviewed the patient's vital signs, nursing notes, and other relevant tests and information. I  had a detailed discussion regarding the historical points, exam findings, and any diagnostic results supporting the diagnosis. Reviewed plan. Extensive return precautions were reviewed. Understanding of the return precautions was demonstated. All questions answered at bedside.  Disposition:  Patient stable for discharge, follow-up with primary care physician as well as cardiology.  Patient understands essential nature of following up with her cardiologist soon as possible for further evaluation outpatient testing.    Problems Addressed:  Abnormal EKG: complicated acute illness or injury     Details: EKG shows what appears to be chronic changes. No prior for comparison. No evidence of acute ischemia.  Anxiousness: acute illness or injury  Chest pain, unspecified type: complicated acute illness or injury  Drug therapy: chronic illness or injury  Hypertension, unspecified type: chronic illness or injury with exacerbation, progression, or side effects of treatment     Details: normalized in ED    Amount and/or Complexity of Data Reviewed  External Data Reviewed: notes.     Details: Recent primary care office note reviewed.  Labs: ordered. Decision-making details documented in ED Course.  Radiology: ordered and independent interpretation performed. Decision-making details documented in ED Course.  ECG/medicine tests: ordered and independent interpretation performed. Decision-making details documented in ED Course.    Risk  OTC drugs.  Prescription drug management.            REASSESSMENT / ED COURSE DOCUMENTATION     ED Course as of 06/02/21 1251   Tue May 31, 2021   1348 EKG demonstrates sinus rhythm, no STEMI, rate of 84 bpm, PR interval 144 ms. [KM]   1435 Chest x-ray demonstrates no acute cardiopulmonary pathology. [KM]   1435 Troponin T: <0.010 [KM]   1443 WBC: 9.7 [KM]   1443 Sodium: 140 [KM]   1630 EDACS SCORE: 9 (low risk) [KM]      ED Course User Index  [KM] Joselyn GlassmanKeegan S Vienna Folden, MD          CONSULTS:  None    PROCEDURES:  Unless otherwise noted below, none     Procedures      FINAL IMPRESSION      1. Chest pain, unspecified type    2. Hypertension, unspecified type    3. Drug therapy    4. Anxiousness    5. Abnormal EKG          DISPOSITION/PLAN   DISPOSITION Decision To Discharge 05/31/2021 05:12:37 PM      PATIENT REFERRED TO:  Levonne Spillerroy Augustus Bunting, MD  [address]  [place] [id number]  [phone]    Schedule an appointment as soon as possible for a visit in 1 week      DISCHARGE MEDICATIONS:  Discharge Medication List as of 05/31/2021  5:12 PM        Controlled Substances Monitoring:     No flowsheet data found.    (Please note that portions of this note were completed with a voice recognition program.  Efforts were made to edit the dictations but occasionally words are mis-transcribed.)    Joselyn GlassmanKeegan S Romaine Maciolek, MD (electronically signed)  Attending Emergency Physician           Joselyn GlassmanKeegan S Janera Peugh, MD  06/02/21 [id number]

## 2021-06-14 ENCOUNTER — Encounter
Admit: 2021-06-14 | Discharge: 2021-06-14 | Payer: MEDICAID | Attending: Student in an Organized Health Care Education/Training Program

## 2021-06-14 DIAGNOSIS — J019 Acute sinusitis, unspecified: Secondary | ICD-10-CM

## 2021-06-14 DIAGNOSIS — B9689 Other specified bacterial agents as the cause of diseases classified elsewhere: Secondary | ICD-10-CM

## 2021-06-14 MED ORDER — BENZONATATE 100 MG PO CAPS
100 MG | ORAL_CAPSULE | Freq: Three times a day (TID) | ORAL | 0 refills | Status: AC | PRN
Start: 2021-06-14 — End: 2021-06-21

## 2021-06-14 MED ORDER — DOXYCYCLINE HYCLATE 100 MG PO TABS
100 MG | ORAL_TABLET | Freq: Two times a day (BID) | ORAL | 0 refills | Status: AC
Start: 2021-06-14 — End: 2021-06-21

## 2021-06-14 NOTE — Progress Notes (Addendum)
Date of Service: 06/13/2021  Patient: Shannon Marshall  DOB: 06-02-1970  No chief complaint on file.        History of Present Illness:  Shannon Marshall a 51 y.o. female presents today for evaluation of the following issues: f/u from ED visit, chest pain, GERD, perimenopause    Pt has a follow up with Dr. Tracey Harries at [facility] on April 24th. Pt did go to Dr. Pincus Badder, but wishes to cancel future appts and test in order to get established with cardiology closer to her home in [place].   Pt denies chest pain since the ED visit. She continues on Prilosec.   Perimenopause  Prescribed Lexapro 10 mg daily and Vagifem vaginal tablet.   Pt is having her menstrual cycle now, starting yesterday. Her previous menstrual cycle was 2 months ago.   Pt has less vaginal dryness with vaginal suppositories.   Pt is taking the Lexapro 10 mg x2 weeks. Pt states she feels so much better with less anxiety. She states she feels calm. Pt has a therapist for the PTSD.     Current Outpatient Medications   Medication Sig Dispense Refill    Multiple Vitamins-Minerals (MULTIVITAMIN ADULTS PO) Take by mouth      Fexofenadine HCl (ALLEGRA ALLERGY PO) Take by mouth      lisinopril (PRINIVIL;ZESTRIL) 5 MG tablet Take 1 tablet by mouth every morning Hold DOS per protocol 90 tablet 1    escitalopram (LEXAPRO) 10 MG tablet Take 1 tablet by mouth daily (Patient not taking: Reported on 05/31/2021) 30 tablet 3    Estradiol (VAGIFEM) 10 MCG TABS vaginal tablet Starting dosing: Insert 1 tablet intravaginally daily for 2 weeks, followed by twice weekly. 18 tablet 0    Estradiol (VAGIFEM) 10 MCG TABS vaginal tablet Insert 1 tablet intravaginally twice weekly (Patient not taking: Reported on 05/31/2021) 24 tablet 3    omeprazole (PRILOSEC) 20 MG delayed release capsule Take 1 capsule by mouth every morning (before breakfast) 30 capsule 5    ibuprofen (ADVIL;MOTRIN) 600 MG tablet Take 1 tablet by mouth every 6 hours as needed for Pain  (Patient not taking: Reported on 05/31/2021) 20 tablet 0    fluticasone (FLONASE) 50 MCG/ACT nasal spray 2 sprays by Nasal route as needed for Allergies       No current facility-administered medications for this visit.       Allergies   Allergen Reactions    Penicillins Anaphylaxis and Swelling            Patient Active Problem List   Diagnosis    Menopausal and female climacteric states    Metrorrhagia    Mixed stress and urge urinary incontinence    Urinary hesitancy    Irregular menses    Obesity, unspecified    Mastodynia    Essential hypertension     Past Medical History:   Diagnosis Date    Anxiety     Blood type, Rh negative     Hypertension     Seasonal allergies     Sleep apnea      Past Surgical History:   Procedure Laterality Date    BREAST BIOPSY Right 2015    CESAREAN SECTION N/A     2008 2010 2013    COLONOSCOPY N/A 03/31/2021    COLONOSCOPY DIAGNOSTIC performed by Gwenlyn Found, MD at [facility] MAIN OR    DILATION AND CURETTAGE N/A 1997    SINUS SURGERY      WISDOM TOOTH  EXTRACTION       Family History   Problem Relation Age of Onset    High Cholesterol Father     High Blood Pressure Father     Coronary Art Dis Father     Heart Attack Father     Alcohol Abuse Father     High Blood Pressure Brother     Breast Cancer Paternal Aunt     High Cholesterol Maternal Grandmother     High Blood Pressure Maternal Grandmother     Diabetes Maternal Grandmother     Stroke Maternal Grandmother     Diabetes Paternal Grandmother     Heart Disease Paternal Grandmother      Social History     Tobacco Use    Smoking status: Never    Smokeless tobacco: Never   Substance Use Topics    Alcohol use: Never     Social History     Social History Narrative    Not on file         ROS:    Review of Systems    See HPI, all other ROS are negative         BP 132/74   Pulse 85   Temp 98.3 F (36.8 C)   Ht  (1.676 m)   Wt 221 lb 9.6 oz (100.5 kg)   SpO2 97%   BMI 35.77 kg/m     General: conversive, pleasant, awake, alert    HEENT: TM pearly grey and intact with swollen ear canal worse on right, PERRLA, conjunctiva normal, Neck supple, no lymphadenopathy, mucus membranes moist, no thyromegaly  Respiratory: breath sounds clear throughout, non-labored breathing, intermittent cough during exam  Cardiovascular: RRR, no MGR, no carotid bruits, radial and pedal pulses, no edema  Neuro: A/O x4, speech normal, CN II-XII intact, no focal motor or sensory deficits  MSK: Full ROM in BUE and BLE  Skin: no rashes, warm, dry  Psychiatric: cooperative  Reproductive: deferred     ASSESSMENT and PLAN:  1. Acute bacterial sinusitis  -Pt to take if still symptomatic on day 10 of illness or if symptoms worsen, such as temperature of 100.4 or greater  - benzonatate (TESSALON) 100 MG capsule; Take 1-2 capsules by mouth 3 times daily as needed for Cough  Dispense: 60 capsule; Refill: 0  - doxycycline hyclate (VIBRA-TABS) 100 MG tablet; Take 1 tablet by mouth 2 times daily for 7 days  Dispense: 14 tablet; Refill: 0  Allergy, Bronchitis, URI, Sinusitis, Pharyngitis, Pneumonia, and Rhinitis Treatment  -Treatment of these complex symptoms include trigger avoidance, medications, and/or nasal rinsing irrigation, optimizing your body's defenses. Antibiotics are far less effective, have more risks, and should be used rarely.   Trigger avoidance-Reduce exposure to tobacco smoke, pollutants, and irritants like perfumes, scented products, and keeping pets out of room. Dust room weekly to include ceiling fans, doors, walls, and furniture. Change air filters regularly. Use a mattress cover to protect against dust mites.   Antihistamine-Xyzal, Zyrtec, Claritin, or Allegra in the morning and Benadryl (Diphenhydramine) at bedtime (no Benadryl >65 years age). Use for 30 days at onset of symptoms.   Nasal decongestants-Afrin or Neo-synephrine. Should not be used for more than 3 days at a time due to rebound rhinitis. Use in each side, wait one minute, then repeat. Do  sequence twice daily for 3 days. Avoid decongestants if patient has history of high blood pressure.   Nasal rinsing and irrigation-Nasal Isotonic Saline Neilmed soulution twice or more times  a day for 30 days from onset of symptoms. Nasal rinsing should be done before use of nasal medication. Use distilled water only and mix according to instructions with prepared solution. Irrigation pots and bottle sprayers may be used for nasal irrigation. Prepared saline can also be used.   Nasal glucocorticoid-A nasal glucocorticoid, fluticasone  (ie. Flonase, Veramyst, Nasocort, etc). Use one spray in each nostril twice per day for at least 30 days. How to use a nasal spray: hold one nostril closed with a finger, position head with chin slightly tucked, direct spray away from the nasal septum (cartilage that divides the two sides of the nose), dispense and then sniff in slightly to pull into higher parts of the nose. (Sniffing to hard will result in medicine draining down the throat, so avoid). Do not blow nose for at least 5 minutes after medication administration.   Antitussives aka cough suppressants for dry cough-Tessalon Perles, Delsym, Robitussin DM, Mucinex DM, Nyquil. Best to use at night.   Expectorants-Guaifenesin generic for Robitussin and Mucinex. Best to use during day. Help to rid body of mucus.   Fluids-One plus gallon daily of clear liquid with at least 1/2 total in water. Avoid dairy like milk, ice cream, cheese. Yogurt may be beneficial sparingly. Dairy can cause increased mucus and throat irritation.    Return to care if symptoms worsen, fever greater than 101 F, SOB or CP or continue past 7-10 days.   Misc.: Bruder mask/warm compress is helpful to open clogged ducts in eyes to improve eye blurriness from allergies.Cold compresses are helpful for swollen eyes and dark circles.    2. Perimenopause  -continue on current dose of Lexapro and vaginal suppositories  3. Essential hypertension  -pt to follow up with  Dr. Tracey Harries  4. Gastroesophageal reflux disease without esophagitis   -continue on Prilosec     No orders of the defined types were placed in this encounter.      Return in about 6 weeks (around 07/26/2021) for perimenopause.

## 2021-06-28 ENCOUNTER — Encounter

## 2021-06-28 MED ORDER — ESTRADIOL 10 MCG VA TABS
10 MCG | ORAL_TABLET | VAGINAL | 3 refills | Status: DC
Start: 2021-06-28 — End: 2021-10-05

## 2021-06-28 NOTE — Telephone Encounter (Signed)
Script sent

## 2021-06-28 NOTE — Telephone Encounter (Signed)
Patient has been notified in my chart that rx has been sent.

## 2021-07-26 ENCOUNTER — Encounter
Payer: MEDICAID | Attending: Student in an Organized Health Care Education/Training Program | Primary: Student in an Organized Health Care Education/Training Program

## 2021-07-26 NOTE — Progress Notes (Deleted)
Date of Service: 07/25/2021  Patient: Shannon Marshall  DOB: 1970-11-01  No chief complaint on file.        History of Present Illness:  Shannon Marshall a 51 y.o. female presents today for evaluation of the following issues:  Perimenopause  Lexapro 10 mg x6 weeks and Vagifem vaginal tablet prescribed. Pt has a therapist for the PTSD.     Current Outpatient Medications   Medication Sig Dispense Refill    Estradiol (YUVAFEM) 10 MCG TABS vaginal tablet STARTING DOSING: INSERT 1 TABLET INTRAVAGINALLY DAILY FOR 2 WEEKS, FOLLOWED BY TWICE WEEKLY. 24 tablet 3    Multiple Vitamins-Minerals (MULTIVITAMIN ADULTS PO) Take by mouth      Fexofenadine HCl (ALLEGRA ALLERGY PO) Take by mouth      lisinopril (PRINIVIL;ZESTRIL) 5 MG tablet Take 1 tablet by mouth every morning Hold DOS per protocol 90 tablet 1    escitalopram (LEXAPRO) 10 MG tablet Take 1 tablet by mouth daily 30 tablet 3    Estradiol (VAGIFEM) 10 MCG TABS vaginal tablet Insert 1 tablet intravaginally twice weekly 24 tablet 3    omeprazole (PRILOSEC) 20 MG delayed release capsule Take 1 capsule by mouth every morning (before breakfast) 30 capsule 5    ibuprofen (ADVIL;MOTRIN) 600 MG tablet Take 1 tablet by mouth every 6 hours as needed for Pain (Patient not taking: Reported on 05/31/2021) 20 tablet 0    fluticasone (FLONASE) 50 MCG/ACT nasal spray 2 sprays by Nasal route as needed for Allergies       No current facility-administered medications for this visit.       Allergies   Allergen Reactions    Penicillins Anaphylaxis and Swelling            Patient Active Problem List   Diagnosis    Perimenopause    Metrorrhagia    Mixed stress and urge urinary incontinence    Urinary hesitancy    Irregular menses    Obesity, unspecified    Mastodynia    Essential hypertension    Acute bacterial sinusitis    Gastroesophageal reflux disease without esophagitis     Past Medical History:   Diagnosis Date    Anxiety     Blood type, Rh negative     Gastroesophageal reflux disease  without esophagitis 06/14/2021    Hypertension     Seasonal allergies     Sleep apnea      Past Surgical History:   Procedure Laterality Date    BREAST BIOPSY Right 2015    CESAREAN SECTION N/A     2008 2010 2013    COLONOSCOPY N/A 03/31/2021    COLONOSCOPY DIAGNOSTIC performed by Gwenlyn Found, MD at [facility] MAIN OR    DILATION AND CURETTAGE N/A 1997    SINUS SURGERY      WISDOM TOOTH EXTRACTION       Family History   Problem Relation Age of Onset    High Cholesterol Father     High Blood Pressure Father     Coronary Art Dis Father     Heart Attack Father     Alcohol Abuse Father     High Blood Pressure Brother     Breast Cancer Paternal Aunt     High Cholesterol Maternal Grandmother     High Blood Pressure Maternal Grandmother     Diabetes Maternal Grandmother     Stroke Maternal Grandmother     Diabetes Paternal Grandmother     Heart Disease Paternal Grandmother  Social History     Tobacco Use    Smoking status: Never    Smokeless tobacco: Never   Substance Use Topics    Alcohol use: Never     Social History     Social History Narrative    Not on file         ROS:    Review of Systems    See HPI, all other ROS are negative         There were no vitals taken for this visit.    General: conversive, pleasant, awake, alert   HEENT: TM pearly grey and intact, PERRLA, conjunctiva normal, Neck supple, no lymphadenopathy, mucus membranes moist, no thyromegaly  Respiratory: breath sounds clear throughout, non-labored breathing  Cardiovascular: RRR, no MGR, no carotid bruits, radial and pedal pulses, no edema  GI: non-distended, non-tender, soft, no liver edge, no masses  GU: no suprapubic tenderness  Neuro: A/O x4, speech normal, CN II-XII intact, no focal motor or sensory deficits  MSK: Full ROM in BUE and BLE  Skin: no rashes, warm, dry  Psychiatric: cooperative  Reproductive: deferred     ASSESSMENT and PLAN:  There are no diagnoses linked to this encounter.     No orders of the defined types were placed in this  encounter.      No follow-ups on file.    {Time Documentation Optional:[id number]}

## 2021-07-28 ENCOUNTER — Encounter

## 2021-07-28 MED ORDER — ESCITALOPRAM OXALATE 10 MG PO TABS
10 MG | ORAL_TABLET | ORAL | 3 refills | Status: AC
Start: 2021-07-28 — End: 2021-12-01

## 2021-07-28 MED ORDER — OMEPRAZOLE 20 MG PO CPDR
20 MG | ORAL_CAPSULE | ORAL | 5 refills | Status: AC
Start: 2021-07-28 — End: 2022-02-21

## 2021-10-05 ENCOUNTER — Ambulatory Visit
Admit: 2021-10-05 | Discharge: 2021-10-05 | Payer: MEDICAID | Attending: Student in an Organized Health Care Education/Training Program | Primary: Student in an Organized Health Care Education/Training Program

## 2021-10-05 DIAGNOSIS — N951 Menopausal and female climacteric states: Secondary | ICD-10-CM

## 2021-10-05 MED ORDER — ESTRADIOL 0.1 MG/GM VA CREA
0.1 MG/GM | VAGINAL | 3 refills | Status: AC
Start: 2021-10-05 — End: ?

## 2021-10-05 NOTE — Progress Notes (Signed)
Date of Service: 10/04/2021  Patient: Shannon Marshall  DOB: [birth year]  Chief Complaint   Patient presents with    Other     Perimenopause          History of Present Illness:  Shannon Marshall a 51 y.o. female presents today for evaluation of the following issues:  Perimenopause  Lexapro 10 mg and vaginal suppositories prescribed. Pt needing vaginal suppositories 3xs a week instead of twice a week.   Lexapro has been helpful. Sleep has improved. Pt is going to a therapist every 2 weeks. 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       Current Outpatient Medications   Medication Sig Dispense Refill    omeprazole (PRILOSEC) 20 MG delayed release capsule TAKE 1 CAPSULE BY MOUTH EVERY DAY IN THE MORNING BEFORE BREAKFAST 30 capsule 5    escitalopram (LEXAPRO) 10 MG tablet TAKE 1 TABLET BY MOUTH EVERY DAY 30 tablet 3    Estradiol (YUVAFEM) 10 MCG TABS vaginal tablet STARTING DOSING: INSERT 1 TABLET INTRAVAGINALLY DAILY FOR 2 WEEKS, FOLLOWED BY TWICE WEEKLY. 24 tablet 3    Multiple Vitamins-Minerals (MULTIVITAMIN ADULTS PO) Take by mouth      Fexofenadine HCl (ALLEGRA ALLERGY PO) Take by mouth      lisinopril (PRINIVIL;ZESTRIL) 5 MG tablet Take 1 tablet by mouth every morning Hold DOS per protocol 90 tablet 1    Estradiol (VAGIFEM) 10 MCG TABS vaginal tablet Insert 1 tablet intravaginally twice weekly 24 tablet 3    ibuprofen (ADVIL;MOTRIN) 600 MG tablet Take 1 tablet by mouth every 6 hours as needed for Pain (Patient not taking: Reported on 05/31/2021) 20 tablet 0    fluticasone (FLONASE) 50 MCG/ACT nasal spray 2 sprays by Nasal route as needed for Allergies       No current facility-administered medications for this visit.       Allergies   Allergen Reactions    Penicillins Anaphylaxis and Swelling            Patient Active Problem List   Diagnosis    Perimenopause    Metrorrhagia    Mixed stress and urge urinary incontinence    Urinary hesitancy    Irregular  menses    Obesity, unspecified    Mastodynia    Essential hypertension    Acute bacterial sinusitis    Gastroesophageal reflux disease without esophagitis     Past Medical History:   Diagnosis Date    Anxiety     Blood type, Rh negative     Gastroesophageal reflux disease without esophagitis 06/14/2021    Hypertension     Seasonal allergies     Sleep apnea      Past Surgical History:   Procedure Laterality Date    BREAST BIOPSY Right 2015    CESAREAN SECTION N/A     2008 2010 2013    COLONOSCOPY N/A 03/31/2021    COLONOSCOPY DIAGNOSTIC performed by Gwenlyn Found, MD at [facility]    DILATION AND CURETTAGE N/A 1997    SINUS SURGERY      WISDOM TOOTH EXTRACTION       Family History   Problem Relation Age of Onset    High Cholesterol Father     High Blood Pressure Father     Coronary Art Dis Father     Heart Attack Father     Alcohol Abuse Father     High Blood Pressure Brother  Breast Cancer Paternal Aunt     High Cholesterol Maternal Grandmother     High Blood Pressure Maternal Grandmother     Diabetes Maternal Grandmother     Stroke Maternal Grandmother     Diabetes Paternal Grandmother     Heart Disease Paternal Grandmother      Social History     Tobacco Use    Smoking status: Never    Smokeless tobacco: Never   Substance Use Topics    Alcohol use: Never     Social History     Social History Narrative    Not on file         ROS:    Review of Systems    See HPI, all other ROS are negative         BP 120/60   Pulse 81   Temp 98.7 F (37.1 C)   Ht 5\' 6"  (1.676 m)   Wt 224 lb 9.6 oz (101.9 kg)   SpO2 97%   BMI 36.25 kg/m     General: conversive, pleasant, awake, alert   HEENT: conjunctiva normal, Neck supple, no lymphadenopathy, mucus membranes moist, no thyromegaly  Respiratory: breath sounds clear throughout, non-labored breathing  Cardiovascular: RRR, no MGR, no carotid bruits, radial and pedal pulses, trace BLE pitting edema  Neuro: A/O x4, speech normal, CN II-XII intact, no focal motor or sensory  deficits  MSK: Full ROM in BUE and BLE  Skin: no rashes, warm, dry  Psychiatric: cooperative  Reproductive: deferred     ASSESSMENT and PLAN:  1. Perimenopause  - estradiol (ESTRACE) 0.1 MG/GM vaginal cream; Place 1 g vaginally three times a week  Dispense: 40 g; Refill: 3    2. Essential hypertension  - CBC with Auto Differential; Future  - Comprehensive Metabolic Panel; Future   Edema  Management of lower extremity edema with fluid and salt restriction, administration of diuretic as needed, and elevation of the lower extremities when possible, review use of TED hose, proper foot and skin care, instruct to avoid OTC meds that can cause edema like NSAIDS & alcohol - Daily sodium should be between 1,500-2,300 mg a day. All the above was discussed with patient at length.     No orders of the defined types were placed in this encounter.      Return in about 6 months (around 04/07/2022) for HTN, perimenopause.

## 2021-11-27 ENCOUNTER — Encounter

## 2021-11-28 ENCOUNTER — Encounter: Payer: Self-pay | Admitting: *Deleted

## 2021-11-28 ENCOUNTER — Ambulatory Visit
Admit: 2021-11-28 | Discharge: 2021-11-28 | Payer: MEDICAID | Attending: Family Medicine | Primary: Student in an Organized Health Care Education/Training Program

## 2021-11-28 ENCOUNTER — Ambulatory Visit
Admit: 2021-11-28 | Discharge: 2021-11-28 | Payer: MEDICAID | Primary: Student in an Organized Health Care Education/Training Program

## 2021-11-28 ENCOUNTER — Encounter

## 2021-11-28 DIAGNOSIS — M542 Cervicalgia: Secondary | ICD-10-CM

## 2021-11-28 MED ORDER — METHOCARBAMOL 750 MG PO TABS
750 MG | ORAL_TABLET | Freq: Three times a day (TID) | ORAL | 0 refills | Status: AC
Start: 2021-11-28 — End: 2021-12-08

## 2021-11-28 NOTE — Addendum Note (Signed)
Addended by: Leata Mouse on: 11/28/2021 05:34 PM     Modules accepted: Orders

## 2021-11-28 NOTE — Progress Notes (Signed)
Subjective:neck pain      Patient ID: Shannon Marshall       Headache   The patient is a 51 y.o. female with post neck pain and headaches over past 3 days no fever no uri no trauma. No n umbness tingling or weakness no meds used other than nsaids. Some pain with movement and bending.    Review of Systems   Neurological:  Positive for headaches.   : As noted in the HPI    Past Medical History:   Diagnosis Date    Anxiety     Blood type, Rh negative     Gastroesophageal reflux disease without esophagitis 06/14/2021    Hypertension     Seasonal allergies     Sleep apnea         Past Surgical History:   Procedure Laterality Date    BREAST BIOPSY Right 2015    CESAREAN SECTION N/A     2008 2010 2013    COLONOSCOPY N/A 03/31/2021    COLONOSCOPY DIAGNOSTIC performed by Inetta Fermo, MD at [place] EXTRA[facility]          Allergies   Allergen Reactions    Penicillins Anaphylaxis and Swelling               Current Outpatient Medications   Medication Sig Dispense Refill    estradiol (ESTRACE) 0.1 MG/GM vaginal cream Place 1 g vaginally three times a week 40 g 3    omeprazole (PRILOSEC) 20 MG delayed release capsule TAKE 1 CAPSULE BY MOUTH EVERY DAY IN THE MORNING BEFORE BREAKFAST 30 capsule 5    escitalopram (LEXAPRO) 10 MG tablet TAKE 1 TABLET BY MOUTH EVERY DAY 30 tablet 3    Multiple Vitamins-Minerals (MULTIVITAMIN ADULTS PO) Take by mouth      Fexofenadine HCl (ALLEGRA ALLERGY PO) Take by mouth      lisinopril (PRINIVIL;ZESTRIL) 5 MG tablet Take 1 tablet by mouth every morning Hold DOS per protocol 90 tablet 1    fluticasone (FLONASE) 50 MCG/ACT nasal spray 2 sprays by Nasal route as needed for Allergies      ibuprofen (ADVIL;MOTRIN) 600 MG tablet Take 1 tablet by mouth every 6 hours as needed for Pain (Patient not taking: Reported on 05/31/2021) 20 tablet 0     No current facility-administered medications for this visit.        BP 126/82 (Site: Left  Upper Arm, Position: Sitting, Cuff Size: Large Adult)   Pulse 75   Temp 98.7 F (37.1 C) (Oral)   Resp 18   Ht 5\' 7"  (1.702 m)   Wt 220 lb (99.8 kg)   SpO2 96%   BMI 34.46 kg/m       Objective:   Physical Exam  Constitutional:       General: She is not in acute distress.     Appearance: Normal appearance. She is not ill-appearing, toxic-appearing or diaphoretic.   HENT:      Head: Normocephalic.      Right Ear: Tympanic membrane and ear canal normal.      Left Ear: Tympanic membrane and ear canal normal.      Nose: Nose normal.      Mouth/Throat:      Mouth: Mucous membranes are moist.      Pharynx: No oropharyngeal exudate or posterior oropharyngeal erythema.   Eyes:  General: No scleral icterus.        Right eye: No discharge.         Left eye: No discharge.      Extraocular Movements: Extraocular movements intact.      Conjunctiva/sclera: Conjunctivae normal.      Pupils: Pupils are equal, round, and reactive to light.   Cardiovascular:      Rate and Rhythm: Normal rate and regular rhythm.      Pulses: Normal pulses.      Heart sounds: Normal heart sounds.   Pulmonary:      Effort: Pulmonary effort is normal.      Breath sounds: Normal breath sounds.   Musculoskeletal:      Cervical back: Spasms, tenderness and bony tenderness present. No swelling, edema, deformity, erythema, signs of trauma, lacerations, rigidity, torticollis or crepitus. Pain with movement present. Decreased range of motion.      Thoracic back: Normal.      Lumbar back: Normal.   Skin:     General: Skin is warm and dry.   Neurological:      General: No focal deficit present.      Mental Status: She is alert.   Psychiatric:         Mood and Affect: Mood normal.         No scans are attached to the encounter.     Assessment:   1. Cervicalgia  -     XR CERVICAL SPINE (4-5 VIEWS)       Plan:   No results found for any visits on 11/28/21.     Xray shows djd no acute changes stretch nsaids. Fu as needed worsening symptoms fu as  needed       Mohammed Kindle, MD

## 2021-11-29 LAB — COMPREHENSIVE METABOLIC PANEL
ALT: 11 U/L (ref 0–35)
AST: 12 U/L (ref 0–35)
Albumin/Globulin Ratio: 1.7 (ref 1.00–2.70)
Albumin: 4 g/dL (ref 3.5–5.2)
Alk Phosphatase: 74 U/L (ref 35–117)
Anion Gap: 11 mmol/L (ref 2–17)
BUN: 14 mg/dL (ref 6–20)
CO2: 24 mmol/L (ref 22–29)
Calcium: 9.1 mg/dL (ref 8.6–10.0)
Chloride: 102 mmol/L (ref 98–107)
Creatinine: 0.7 mg/dL (ref 0.5–1.0)
Est, Glom Filt Rate: 105 mL/min/1.73m (ref >=60)
Globulin: 2.3 g/dL (ref 1.9–4.4)
Glucose: 98 mg/dL (ref 70–99)
OSMOLALITY CALCULATED: 274 mOsm/kg (ref 270–287)
Potassium: 4.2 mmol/L (ref 3.5–5.3)
Sodium: 137 mmol/L (ref 135–145)
Total Bilirubin: <0.15 mg/dL (ref 0.00–1.20)
Total Protein: 6.3 g/dL — ABNORMAL LOW (ref 6.4–8.3)

## 2021-11-29 LAB — LIPID PANEL
Chol/HDL Ratio: 4.3 (ref 0.0–4.4)
Cholesterol: 150 mg/dL (ref 100–200)
HDL: 35 mg/dL — ABNORMAL LOW (ref >=50)
LDL Cholesterol: 40.2 mg/dL (ref 0.0–100.0)
LDL/HDL Ratio: 1.1
Triglycerides: 374 mg/dL — ABNORMAL HIGH (ref 0–149)
VLDL: 74.8 mg/dL — ABNORMAL HIGH (ref 5.0–40.0)

## 2021-11-29 LAB — CBC WITH AUTO DIFFERENTIAL
Absolute Baso #: 0.1 10*3/uL (ref 0.0–0.2)
Absolute Eos #: 0.2 10*3/uL (ref 0.0–0.5)
Absolute Lymph #: 2.3 10*3/uL (ref 1.0–3.2)
Absolute Mono #: 0.6 10*3/uL (ref 0.3–1.0)
Basophils %: 0.7 % (ref 0.0–2.0)
Eosinophils %: 1.7 % (ref 0.0–7.0)
Hematocrit: 35.9 % (ref 34.0–47.0)
Hemoglobin: 12.7 g/dL (ref 11.5–15.7)
Immature Grans (Abs): 0.09 10*3/uL — ABNORMAL HIGH (ref 0.00–0.06)
Immature Granulocytes: 1 % — ABNORMAL HIGH (ref 0.0–0.6)
Lymphocytes: 25.5 % (ref 15.0–45.0)
MCH: 31.1 pg (ref 27.0–34.5)
MCHC: 35.4 g/dL (ref 32.0–36.0)
MCV: 87.8 fL (ref 81.0–99.0)
MPV: 10 fL (ref 7.2–13.2)
Monocytes: 6.2 % (ref 4.0–12.0)
NRBC Absolute: 0 10*3/uL (ref 0.000–0.012)
NRBC Automated: 0 % (ref 0.0–0.2)
Neutrophils %: 64.9 % (ref 42.0–74.0)
Neutrophils Absolute: 5.7 10*3/uL (ref 1.6–7.3)
Platelets: 269 10*3/uL (ref 140–440)
RBC: 4.09 x10e6/mcL (ref 3.60–5.20)
RDW: 12.8 % (ref 11.0–16.0)
WBC: 8.8 10*3/uL (ref 3.8–10.6)

## 2021-11-29 LAB — VITAMIN B12: Vitamin B-12: 334 pg/mL (ref 232–1245)

## 2021-11-29 LAB — VITAMIN D 25 HYDROXY: Vit D, 25-Hydroxy: 33.9 ng/mL (ref 30.0–90.0)

## 2021-11-29 LAB — TROPONIN: Troponin T: <0.01 ng/mL (ref 0.000–0.010)

## 2021-11-29 LAB — MICROALBUMIN, UR: Microalb, Ur: <0.3 mg/dL (ref 0.0–20.0)

## 2021-11-29 LAB — TSH WITH REFLEX: TSH: 2.83 mcIU/mL (ref 0.358–3.740)

## 2021-12-01 ENCOUNTER — Encounter

## 2021-12-01 MED ORDER — ESCITALOPRAM OXALATE 10 MG PO TABS
10 MG | ORAL_TABLET | Freq: Every day | ORAL | 3 refills | Status: DC
Start: 2021-12-01 — End: 2022-04-05

## 2021-12-01 NOTE — Telephone Encounter (Signed)
Patient walked into office requesting a refill on rx: Escitalopram 10MG . Verified pharmacy on account. I informed patient I will notify office and a return call will be made when available.

## 2022-01-09 ENCOUNTER — Ambulatory Visit
Admit: 2022-01-09 | Discharge: 2022-01-09 | Payer: MEDICAID | Attending: Physician Assistant | Primary: Student in an Organized Health Care Education/Training Program

## 2022-01-09 DIAGNOSIS — R110 Nausea: Secondary | ICD-10-CM

## 2022-01-09 LAB — POC COVID-19 & INFLUENZA COMBO (LIAT IN HOUSE)
INFLUENZA A: NOT DETECTED
INFLUENZA B: NOT DETECTED
SARS-CoV-2: NOT DETECTED

## 2022-01-09 NOTE — Progress Notes (Signed)
01/09/22     Shannon Marshall , female , 51 y.o.     51 yo female presents with nausea since yesterday. Denies vomiting, cough, sore throat fever, sinus congestion or body aches. Here with her children who have flu like symptoms           Current Outpatient Medications   Medication Sig Dispense Refill    escitalopram (LEXAPRO) 10 MG tablet Take 1 tablet by mouth daily 90 tablet 3    estradiol (ESTRACE) 0.1 MG/GM vaginal cream Place 1 g vaginally three times a week 40 g 3    omeprazole (PRILOSEC) 20 MG delayed release capsule TAKE 1 CAPSULE BY MOUTH EVERY DAY IN THE MORNING BEFORE BREAKFAST 30 capsule 5    Fexofenadine HCl (ALLEGRA ALLERGY PO) Take by mouth      lisinopril (PRINIVIL;ZESTRIL) 5 MG tablet Take 1 tablet by mouth every morning Hold DOS per protocol 90 tablet 1    fluticasone (FLONASE) 50 MCG/ACT nasal spray 2 sprays by Nasal route as needed for Allergies      Multiple Vitamins-Minerals (MULTIVITAMIN ADULTS PO) Take by mouth (Patient not taking: Reported on 01/09/2022)      ibuprofen (ADVIL;MOTRIN) 600 MG tablet Take 1 tablet by mouth every 6 hours as needed for Pain (Patient not taking: Reported on 05/31/2021) 20 tablet 0     No current facility-administered medications for this visit.        Past Medical History:   Diagnosis Date    Anxiety     Blood type, Rh negative     Gastroesophageal reflux disease without esophagitis 06/14/2021    Hypertension     Seasonal allergies     Sleep apnea         Past Surgical History:   Procedure Laterality Date    BREAST BIOPSY Right 2015    CESAREAN SECTION N/A     2008 2010 2013    COLONOSCOPY N/A 03/31/2021    COLONOSCOPY DIAGNOSTIC performed by Inetta Fermo, MD at [place] [facility]TRA[facility]          Family History   Problem Relation Age of Onset    High Cholesterol Father     High Blood Pressure Father     Coronary Art Dis Father     Heart Attack Father     Alcohol Abuse Father     High Blood  Pressure Brother     Breast Cancer Paternal Aunt     High Cholesterol Maternal Grandmother     High Blood Pressure Maternal Grandmother     Diabetes Maternal Grandmother     Stroke Maternal Grandmother     Diabetes Paternal Grandmother     Heart Disease Paternal Grandmother         Social History     Socioeconomic History    Marital status: Divorced     Spouse name: Not on file    Number of children: Not on file    Years of education: Not on file    Highest education level: Not on file   Occupational History    Not on file   Tobacco Use    Smoking status: Never    Smokeless tobacco: Never   Vaping Use    Vaping Use: Never used   Substance and Sexual Activity    Alcohol use: Never    Drug use: Never  Sexual activity: Not on file   Other Topics Concern    Not on file   Social History Narrative    Not on file     Social Determinants of Health     Financial Resource Strain: Not on file   Food Insecurity: Not on file   Transportation Needs: Not on file   Physical Activity: Not on file   Stress: Not on file   Social Connections: Not on file   Intimate Partner Violence: Not on file   Housing Stability: Not on file        Allergies   Allergen Reactions    Penicillins Anaphylaxis and Swelling               Vitals:    01/09/22 1048   BP: 112/74   Pulse: 73   Resp: 16   Temp: 98.5 F (36.9 C)   SpO2: 99%        Physical Exam  Constitutional:       Appearance: Normal appearance.   HENT:      Head: Normocephalic and atraumatic.      Nose: Nose normal.      Mouth/Throat:      Mouth: Mucous membranes are moist.   Cardiovascular:      Rate and Rhythm: Normal rate and regular rhythm.   Pulmonary:      Effort: Pulmonary effort is normal.      Breath sounds: Normal breath sounds.   Skin:     General: Skin is warm and dry.   Neurological:      General: No focal deficit present.      Mental Status: She is alert.   Psychiatric:         Mood and Affect: Mood normal.         Behavior: Behavior normal.         Thought Content: Thought  content normal.          Results/Reports    Recent Results (from the past 24 hour(s))   POC COVID-19 & Influenza Combo ([facility])    Collection Time: 01/09/22 10:55 AM   Result Value Ref Range    SARS-CoV-2 Not Detected Not Detected    INFLUENZA A Not Detected Not Detected    INFLUENZA B Not Detected Not Detected   #    Assessment/Plan      Isys was seen today for nausea.    Diagnoses and all orders for this visit:    Nausea  -     POC COVID-19 & Influenza Combo ([facility])    Rest and push fluids. Take over the counter cold/sinus medications for symptoms. Follow up for any new or worsening symptoms     Electronically signed by    Mackie Pai, PA-C

## 2022-02-09 ENCOUNTER — Encounter
Payer: MEDICAID | Attending: Obstetrics & Gynecology | Primary: Student in an Organized Health Care Education/Training Program

## 2022-02-09 NOTE — Progress Notes (Unsigned)
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  She has a history of irregular cycles and have been giving her Provera withdrawal every 35 days but she ran out of it and has not had a flow in about 2 months so I will refill that and recommend that she take it monthly if she does not have a flow.  Mammogram order given.  I will refer her for colonoscopy screening and Hemoccult was negative today.  I will also refer her to establish care with primary care.  She stopped her Pamelor and has been off of it for about 3 months but does see cognitive behavioral therapy for her depression and PTSD.  Monthly SBE recommended with a healthy lifestyle.  Pap smear performed.  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 In the past she  was having irregular cycles and I given her progesterone withdrawal  to take every 35 days but she actually has not needed it for the last 3  or 4 months and her cycles are coming about once a month only  lasting 3 days and light. I will also given her Pamelor because she was  having some anxiety but it was giving her headaches that she  discontinued it and she is doing some cognitive behavioral therapy  and sees a counselor as well and is actually not any medication other  than her hypertension medication. I'll plan on seeing her in October  for her annual. She had her last mammogram and [facility] and says that  her breasts were dense and was asking if she needed an MRI and I  offered her that option but explained that it may be costly and that I  highly recommend monthly self breast exams and notify me if she has  the abnormalities. We will schedule screening mammogram I see her  in October. 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 Of note  one of her children is disabled and very difficult for her to manage.  She is been struggling with menstrual cycles every other month and  recently started weight loss with exercise injection had a normal cycle  just recently. She is also try the Ambien for insomnia and she takes  half a tablet several times a week and she is also taking a natural  supplement at helping her as well. She still having pretty significant  depression as a single mom with a disabled child but had problems in  the past Zoloft I will initiate nortriptyline, Pamelor, at bedtime to see  if this helps and we'll plan for a virtual visit in 4 months to see how  she is doing.  1 insomnia,continue Ambien and natural remedies  2 metromenorrhagia, continue low carbohydrate diet, reviewed normal  hormones and FSH 4 we'll give Provera withdrawal as needed if she  does not have a cycle within 35 days and 6 refills given  Preventative, annual due in October  05/04/20 new patient problem visit, 51 year old P3 and one of her kids is  medical reasons in the past 12 months?  None  Alcohol Screen: Denies  Did you have a drink containing alcohol in  the past year? 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  Oldest child disabled 2/2 prematurity

## 2022-02-10 ENCOUNTER — Encounter

## 2022-02-15 ENCOUNTER — Telehealth

## 2022-02-15 MED ORDER — LISINOPRIL 5 MG PO TABS
5 MG | ORAL_TABLET | Freq: Every morning | ORAL | 0 refills | Status: DC
Start: 2022-02-15 — End: 2022-04-03

## 2022-02-15 NOTE — Telephone Encounter (Signed)
Patient is requesting medication refill :    lisinopril (PRINIVIL;ZESTRIL) 5 MG tabletTake 1 tablet by mouth every morning Hold DOS per protocol  no  Pharmacy:  [id number] - [place], [place] - [address]       Patient is requesting tablets until her 04/05/22 visit

## 2022-02-20 ENCOUNTER — Encounter

## 2022-02-21 MED ORDER — OMEPRAZOLE 20 MG PO CPDR
20 MG | ORAL_CAPSULE | ORAL | 5 refills | Status: DC
Start: 2022-02-21 — End: 2022-04-05

## 2022-02-21 NOTE — Telephone Encounter (Signed)
sent 

## 2022-03-23 ENCOUNTER — Encounter
Payer: MEDICAID | Attending: Obstetrics & Gynecology | Primary: Student in an Organized Health Care Education/Training Program

## 2022-03-23 NOTE — Progress Notes (Unsigned)
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  She has a history of irregular cycles and have been giving her Provera withdrawal every 35 days but she ran out of it and has not had a flow in about 2 months so I will refill that and recommend that she take it monthly if she does not have a flow.  Mammogram order given.  I will refer her for colonoscopy screening and Hemoccult was negative today.  I will also refer her to establish care with primary care.  She stopped her Pamelor and has been off of it for about 3 months but does see cognitive behavioral therapy for her depression and PTSD.  Monthly SBE recommended with a healthy lifestyle.  Pap smear performed.  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 In the past she  was having irregular cycles and I given her progesterone withdrawal  to take every 35 days but she actually has not needed it for the last 3  or 4 months and her cycles are coming about once a month only  lasting 3 days and light. I will also given her Pamelor because she was  having some anxiety but it was giving her headaches that she  discontinued it and she is doing some cognitive behavioral therapy  and sees a counselor as well and is actually not any medication other  than her hypertension medication. I'll plan on seeing her in October  for her annual. She had her last mammogram and [facility] and says that  her breasts were dense and was asking if she needed an MRI and I  offered her that option but explained that it may be costly and that I  highly recommend monthly self breast exams and notify me if she has  the abnormalities. We will schedule screening mammogram I see her  in October. It her PCP dislodged and [facility] recommend she establish  care with a new one with that group  05/19/20 virtual visit follow-up, 52 year old P3 single  mother. Of note  one of her children is disabled and very difficult for her to manage.  She is been struggling with menstrual cycles every other month and  recently started weight loss with exercise injection had a normal cycle  just recently. She is also try the Ambien for insomnia and she takes  half a tablet several times a week and she is also taking a natural  supplement at helping her as well. She still having pretty significant  depression as a single mom with a disabled child but had problems in  the past Zoloft I will initiate nortriptyline, Pamelor, at bedtime to see  if this helps and we'll plan for a virtual visit in 4 months to see how  she is doing.  1 insomnia,continue Ambien and natural remedies  2 metromenorrhagia, continue low carbohydrate diet, reviewed normal  hormones and FSH 4 we'll give Provera withdrawal as needed if she  does not have a cycle within 35 days and 6 refills given  Preventative, annual due in October  05/04/20 new patient problem visit, 52 year old P3 and one of her kids is  medical reasons in the past 12 months?

## 2022-03-29 ENCOUNTER — Encounter

## 2022-04-03 MED ORDER — LISINOPRIL 5 MG PO TABS
5 MG | ORAL_TABLET | Freq: Every morning | ORAL | 0 refills | Status: DC
Start: 2022-04-03 — End: 2022-04-05

## 2022-04-05 ENCOUNTER — Ambulatory Visit
Admit: 2022-04-05 | Discharge: 2022-04-05 | Payer: MEDICAID | Attending: Student in an Organized Health Care Education/Training Program | Primary: Student in an Organized Health Care Education/Training Program

## 2022-04-05 DIAGNOSIS — Z1231 Encounter for screening mammogram for malignant neoplasm of breast: Secondary | ICD-10-CM

## 2022-04-05 MED ORDER — SUMATRIPTAN SUCCINATE 50 MG PO TABS
50 MG | ORAL_TABLET | ORAL | 5 refills | Status: AC
Start: 2022-04-05 — End: 2023-02-08

## 2022-04-05 MED ORDER — TRIAMCINOLONE ACETONIDE 0.1 % EX OINT
0.1 % | Freq: Two times a day (BID) | CUTANEOUS | 1 refills | Status: AC | PRN
Start: 2022-04-05 — End: 2022-04-20

## 2022-04-05 MED ORDER — HYDROXYZINE HCL 25 MG PO TABS
25 MG | ORAL_TABLET | Freq: Three times a day (TID) | ORAL | 5 refills | Status: AC | PRN
Start: 2022-04-05 — End: 2022-08-03

## 2022-04-05 MED ORDER — LISINOPRIL 5 MG PO TABS
5 MG | ORAL_TABLET | Freq: Every morning | ORAL | 0 refills | Status: DC
Start: 2022-04-05 — End: 2022-08-07

## 2022-04-05 MED ORDER — ESCITALOPRAM OXALATE 10 MG PO TABS
10 MG | ORAL_TABLET | Freq: Every day | ORAL | 3 refills | Status: AC
Start: 2022-04-05 — End: 2022-08-02

## 2022-04-05 NOTE — Addendum Note (Signed)
Addended by: Timoteo Expose on: 04/05/2022 11:25 AM     Modules accepted: Orders

## 2022-04-05 NOTE — Progress Notes (Signed)
Date of Service: 04/05/2022  Patient: Shannon Marshall  DOB: 10/20/1970  Chief Complaint   Patient presents with    Follow-up    Medication Refill    Hypertension    Referral - General     Pt would like to talk about pre menopause last cycle has been 5 months ago.    Other     Spot on left breast that  is itchy         History of Present Illness:  Shannon Marshall a 52 y.o. female presents today for evaluation of the following issues: perimenopause, HTN  LMP:  5 months ago.    Headache: Shannon Marshall complains of headache. She does not have a headache at this time.     Description of Headaches:  Location of pain: temporal  Radiation of pain?:frontal  Character of pain:crushing, pressure, tingling, and electric shot  Severity of pain: 10  Accompanying symptoms: diplopia, tinnitus, lacrimation, eyelid edema  Rapidity of onset: sudden  Typical duration of individual headache: 1 year  Are most headaches similar in presentation? Yes      Temporal Pattern of Headaches:  Started having HAs 1 year ago  Awaken from sleep?: Yes    Degree of Functional Impairment: severe    Current Use of Meds to Treat HA:  Abortive meds? acetaminophen, NSAIDs   Daily use? No  Pt is not taking Prophylactic meds    Additional Relevant History:  History of head/neck trauma? No  History of head/neck surgery? No  Family h/o headache problems? no  Substance use: minimal caffeine  HTN:  Pt has a history of hypertension  Pt is  compliant with taking medication until recently when ran out.   Pt's antihypertensive medication listed in med list.   Pt is advised to have eye exams yearly and screening for microalbuminuria at least once a year.  Pt denies CP, SOB, DOE, headaches, vision changes, and peripheral edema.  Home monitoring of blood pressure results: not monitoring  Lab Results   Component Value Date    NA 137 11/28/2021    K 4.2 11/28/2021    CL 102 11/28/2021    CO2 24 11/28/2021       Lab Results   Component Value Date    CREATININE 0.7 11/28/2021     BUN 14 11/28/2021    NA 137 11/28/2021    K 4.2 11/28/2021    CL 102 11/28/2021    CO2 24 11/28/2021   Perimenopause:  Prescribed vaginal estradiol cream. LMP 5 months ago. Itching all over started two weeks ago. Rash on left breast. Last mammogram in 2021. Pt is having "horrible headaches" at least once a week. Tylenol and Motrin are not helpful. Headaches can last up to two days.       Current Outpatient Medications   Medication Sig Dispense Refill    escitalopram (LEXAPRO) 10 MG tablet Take 1 tablet by mouth daily 90 tablet 3    lisinopril (PRINIVIL;ZESTRIL) 5 MG tablet Take 1 tablet by mouth every morning 30 tablet 0    hydrOXYzine HCl (ATARAX) 25 MG tablet Take 1 tablet by mouth every 8 hours as needed for Itching 60 tablet 5    triamcinolone (KENALOG) 0.1 % ointment Apply 1 g topically 2 times daily as needed (rash) 15 g 1    SUMAtriptan (IMITREX) 50 MG tablet Take 50 mg by mouth at headache onset, repeat in >2 hrs for persistent headache. Do not exceed 100 mg/24 hrs. 9 tablet 5  estradiol (ESTRACE) 0.1 MG/GM vaginal cream Place 1 g vaginally three times a week 40 g 3    Multiple Vitamins-Minerals (MULTIVITAMIN ADULTS PO) Take by mouth      Fexofenadine HCl (ALLEGRA ALLERGY PO) Take by mouth      ibuprofen (ADVIL;MOTRIN) 600 MG tablet Take 1 tablet by mouth every 6 hours as needed for Pain 20 tablet 0    fluticasone (FLONASE) 50 MCG/ACT nasal spray 2 sprays by Nasal route as needed for Allergies       No current facility-administered medications for this visit.       Allergies   Allergen Reactions    Penicillins Anaphylaxis and Swelling            Patient Active Problem List   Diagnosis    Perimenopause    Metrorrhagia    Mixed stress and urge urinary incontinence    Urinary hesitancy    Irregular menses    Obesity, unspecified    Precordial pain    Essential hypertension    Acute bacterial sinusitis    Gastroesophageal reflux disease without esophagitis    Abnormal ECG    Hypertensive heart disease  without heart failure     Past Medical History:   Diagnosis Date    Anxiety     Blood type, Rh negative     Gastroesophageal reflux disease without esophagitis 06/14/2021    Hypertension     Seasonal allergies     Sleep apnea      Past Surgical History:   Procedure Laterality Date    BREAST BIOPSY Right 2015    CESAREAN SECTION N/A     2008 2010 2013    COLONOSCOPY N/A 03/31/2021    COLONOSCOPY DIAGNOSTIC performed by Shannon Fermo, MD at [facility] EXTRACTION       Family History   Problem Relation Age of Onset    High Cholesterol Father     High Blood Pressure Father     Coronary Art Dis Father     Heart Attack Father     Alcohol Abuse Father     High Blood Pressure Brother     Breast Cancer Paternal Aunt     High Cholesterol Maternal Grandmother     High Blood Pressure Maternal Grandmother     Diabetes Maternal Grandmother     Stroke Maternal Grandmother     Diabetes Paternal Grandmother     Heart Disease Paternal Grandmother      Social History     Tobacco Use    Smoking status: Never    Smokeless tobacco: Never   Substance Use Topics    Alcohol use: Never     Social History     Social History Narrative    Not on file         ROS:    Review of Systems    See HPI, all other ROS are negative         BP 138/82   Pulse 93   Temp 98.2 F (36.8 C)   Ht 1.676 m (5\' 6" )   Wt 105.1 kg (231 lb 11.2 oz)   SpO2 99%   BMI 37.40 kg/m     General: conversive, pleasant, awake, alert   HEENT:  conjunctiva normal, Neck supple, no lymphadenopathy, mucus membranes moist, no thyromegaly  Respiratory: breath sounds clear throughout, non-labored breathing  Cardiovascular: RRR, no MGR, no carotid bruits, radial and pedal pulses, no edema  Neuro: A/O x4, speech normal, CN II-XII intact, no focal motor or sensory deficits  MSK: Full ROM in BUE and BLE  Skin: no rashes, warm, dry  Psychiatric: cooperative  Reproductive: deferred     ASSESSMENT and PLAN:  1.  Perimenopause  - escitalopram (LEXAPRO) 10 MG tablet; Take 1 tablet by mouth daily  Dispense: 90 tablet; Refill: 3  - [facility] - Shannon Mom, MD, Obstetrics & Gynecology, [facility].  start- hydrOXYzine HCl (ATARAX) 25 MG tablet; Take 1 tablet by mouth every 8 hours as needed for Itching  Dispense: 60 tablet; Refill: 5    2. Essential hypertension  - lisinopril (PRINIVIL;ZESTRIL) 5 MG tablet; Take 1 tablet by mouth every morning  Dispense: 30 tablet; Refill: 0  - CBC with Auto Differential; Future  - Comprehensive Metabolic Panel; Future    3. Gastroesophageal reflux disease, unspecified whether esophagitis present    4. Belching    5. Other screening mammogram  - MAM TOMO DIGITAL SCREEN BILATERAL; Future    6. Pruritic condition  - hydrOXYzine HCl (ATARAX) 25 MG tablet; Take 1 tablet by mouth every 8 hours as needed for Itching  Dispense: 60 tablet; Refill: 5  - triamcinolone (KENALOG) 0.1 % ointment; Apply 1 g topically 2 times daily as needed (rash)  Dispense: 15 g; Refill: 1    7. Rash  - triamcinolone (KENALOG) 0.1 % ointment; Apply 1 g topically 2 times daily as needed (rash)  Dispense: 15 g; Refill: 1    8. Nonintractable episodic headache, unspecified headache type  - SUMAtriptan (IMITREX) 50 MG tablet; Take 50 mg by mouth at headache onset, repeat in >2 hrs for persistent headache. Do not exceed 100 mg/24 hrs.  Dispense: 9 tablet; Refill: 5       Orders Placed This Encounter   Procedures    MAM TOMO DIGITAL SCREEN BILATERAL     Further imaging can be completed per [place] protocol.     Standing Status:   Future     Standing Expiration Date:   04/06/2023     Order Specific Question:   Reason for exam:     Answer:   screening for breast cancer    CBC with Auto Differential     Standing Status:   Future     Standing Expiration Date:   04/06/2023    Comprehensive Metabolic Panel     Standing Status:   Future     Standing Expiration Date:   04/06/2023    [facility] - Shannon Mom, MD, Obstetrics  & Gynecology, [facility]     Referral Priority:   Routine     Referral Type:   Eval and Treat     Referral Reason:   Specialty Services Required     Referred to Provider:   Carolyne Littles, MD     Requested Specialty:   Obstetrics & Gynecology     Number of Visits Requested:   1       Return in about 4 weeks (around 05/03/2022) for headaches, rash, itch.

## 2022-04-19 NOTE — Telephone Encounter (Signed)
02/14 LVM, needs to be rescheduled , provider will not be in this day.

## 2022-04-21 NOTE — Telephone Encounter (Signed)
02/14 LVM, needs to be rescheduled , provider will not be in.  02/15 LVM , needs to be rescheduled , provider will not be in.  02/16 LVM, needs to be rescheduled, provider will not be in .

## 2022-05-04 ENCOUNTER — Encounter
Payer: MEDICAID | Attending: Student in an Organized Health Care Education/Training Program | Primary: Student in an Organized Health Care Education/Training Program

## 2022-07-03 ENCOUNTER — Ambulatory Visit
Admit: 2022-07-03 | Discharge: 2022-07-03 | Payer: MEDICAID | Attending: Obstetrics & Gynecology | Primary: Student in an Organized Health Care Education/Training Program

## 2022-07-03 DIAGNOSIS — N951 Menopausal and female climacteric states: Secondary | ICD-10-CM

## 2022-07-03 MED ORDER — ESTRADIOL-NORETHINDRONE ACET 1-0.5 MG PO TABS
1-0.5 MG | ORAL_TABLET | Freq: Every day | ORAL | 11 refills | Status: DC
Start: 2022-07-03 — End: 2022-09-04

## 2022-07-03 NOTE — Progress Notes (Signed)
Shannon Marshall (DOB:  07/04/1970) is a 52 y.o. female,Established patient, here for evaluation of the following chief complaint(s):  New Patient (Pt states she is having headaches, buzzing in her ear, shooting pian in her feet and toes, irregular cycles. Pt had really bad headache with taking Estrace and then had bleeding. Tiredness, migraines. Pt would like to know if it's okay to drink [place] John's Wort tea. )      Assessment & Plan   1. Perimenopause  Extensive counseling. Plan for starting HT. Estradiol oral with progesterone. Will f/u 4 wks. Mammogram ordered. Colonoscopy uptodate wnl within last 10 yrs    Return in about 4 weeks (around 07/31/2022) for Follow Up.       Subjective   HPI  52 y/o presents for perimenopausal symptoms. Periods irregular. Last period in feb. Last period before then 6 months earlier. Complains of  hot flashes. Ot sexually active. Complains of more frequent headaches. Frontal. Nothing makes them better. Has been on vaginal estrace in the past which she elf d/ced due to headaches. She was worried it was making her headache worse. Complains of feeling a buzzing in her body.      Review of Systems   Review of Systems - General ROS: negative  Respiratory ROS: no cough, shortness of breath, or wheezing  Cardiovascular ROS: no chest pain or dyspnea on exertion  Gastrointestinal ROS: no abdominal pain, change in bowel habits, or black or bloody stools  Genito-Urinary ROS: no dysuria, trouble voiding, or hematuria  Musculoskeletal ROS: negative  Neurological ROS: positive for - headaches      Objective   Blood pressure 104/68, height 1.676 m (5\' 6" ), weight 103.9 kg (229 lb), last menstrual period 04/27/2022.    On this date 07/03/2022 I have spent 20 minutes reviewing previous notes, test results and face to face with the patient discussing the diagnosis and importance of compliance with the treatment plan as well as documenting on the day of the visit.      An electronic signature was used  to authenticate this note.    --Junius Finner, MD

## 2022-08-02 ENCOUNTER — Ambulatory Visit
Admit: 2022-08-02 | Discharge: 2022-08-02 | Payer: MEDICAID | Attending: Student in an Organized Health Care Education/Training Program | Primary: Student in an Organized Health Care Education/Training Program

## 2022-08-02 ENCOUNTER — Encounter: Attending: Obstetrics & Gynecology | Primary: Student in an Organized Health Care Education/Training Program

## 2022-08-02 DIAGNOSIS — N951 Menopausal and female climacteric states: Secondary | ICD-10-CM

## 2022-08-02 MED ORDER — ESCITALOPRAM OXALATE 5 MG PO TABS
5 MG | ORAL_TABLET | Freq: Every day | ORAL | 3 refills | Status: DC
Start: 2022-08-02 — End: 2023-06-13

## 2022-08-02 NOTE — Telephone Encounter (Signed)
Pt wanted to let Dr. Haskel Schroeder know the HRT has really helped and improved her symptoms and she is very thankful.

## 2022-08-02 NOTE — Progress Notes (Unsigned)
Date of Service: 08/01/2022  Patient: Shannon Marshall  DOB: Feb 18, 1971  No chief complaint on file.      Patient Care Team:  Ervin Knack, APRN - NP as PCP - General (Nurse Practitioner)  Johnathan Hausen, Kathie Rhodes. Joni Reining, MD as PCP - Empaneled Provider  Schwartzberg, Ethel Rana, MD as Consulting Physician (Obstetrics & Gynecology)    History of Present Illness:  Shannon Marshall a 52 y.o. female presents today for evaluation of the following issues:  Headaches follow up from 04/05/22  Nonintractable episodic headache  Prescribed Imitrex 50 mg, max 100mg /24 hrs      Current Outpatient Medications   Medication Sig Dispense Refill    estradiol-norethindrone (ACTIVELLA) 1-0.5 MG per tablet Take 1 tablet by mouth daily 30 tablet 11    escitalopram (LEXAPRO) 10 MG tablet Take 1 tablet by mouth daily 90 tablet 3    lisinopril (PRINIVIL;ZESTRIL) 5 MG tablet Take 1 tablet by mouth every morning 30 tablet 0    hydrOXYzine HCl (ATARAX) 25 MG tablet Take 1 tablet by mouth every 8 hours as needed for Itching (Patient not taking: Reported on 07/03/2022) 60 tablet 5    SUMAtriptan (IMITREX) 50 MG tablet Take 50 mg by mouth at headache onset, repeat in >2 hrs for persistent headache. Do not exceed 100 mg/24 hrs. 9 tablet 5    estradiol (ESTRACE) 0.1 MG/GM vaginal cream Place 1 g vaginally three times a week (Patient not taking: Reported on 07/03/2022) 40 g 3    Multiple Vitamins-Minerals (MULTIVITAMIN ADULTS PO) Take by mouth      Fexofenadine HCl (ALLEGRA ALLERGY PO) Take by mouth      ibuprofen (ADVIL;MOTRIN) 600 MG tablet Take 1 tablet by mouth every 6 hours as needed for Pain 20 tablet 0    fluticasone (FLONASE) 50 MCG/ACT nasal spray 2 sprays by Nasal route as needed for Allergies       No current facility-administered medications for this visit.       Allergies   Allergen Reactions    Penicillins Anaphylaxis and Swelling            Patient Active Problem List   Diagnosis    Perimenopause    Metrorrhagia    Mixed stress and urge  urinary incontinence    Urinary hesitancy    Irregular menses    Obesity, unspecified    Precordial pain    Essential hypertension    Acute bacterial sinusitis    Gastroesophageal reflux disease without esophagitis    Abnormal ECG    Hypertensive heart disease without heart failure     Past Medical History:   Diagnosis Date    Anxiety     Blood type, Rh negative     Gastroesophageal reflux disease without esophagitis 06/14/2021    Hypertension     Seasonal allergies     Sleep apnea      Past Surgical History:   Procedure Laterality Date    BREAST BIOPSY Right 2015    CESAREAN SECTION N/A     2008 2010 2013    COLONOSCOPY N/A 03/31/2021    COLONOSCOPY DIAGNOSTIC performed by Gwenlyn Found, MD at [facility] MAIN OR    DILATION AND CURETTAGE N/A 1997    SINUS SURGERY      WISDOM TOOTH EXTRACTION       Family History   Problem Relation Age of Onset    High Cholesterol Father     High Blood Pressure Father     Coronary Art  Dis Father     Heart Attack Father     Alcohol Abuse Father     High Blood Pressure Brother     Breast Cancer Paternal Aunt     High Cholesterol Maternal Grandmother     High Blood Pressure Maternal Grandmother     Diabetes Maternal Grandmother     Stroke Maternal Grandmother     Diabetes Paternal Grandmother     Heart Disease Paternal Grandmother      Social History     Tobacco Use    Smoking status: Never    Smokeless tobacco: Never   Substance Use Topics    Alcohol use: Never     Social History     Social History Narrative    Not on file         ROS:    Review of Systems    See HPI, all other ROS are negative         LMP 04/27/2022     General: conversive, pleasant, awake, alert   HEENT: TM pearly grey and intact without excessive cerumen bilaterally, PERRLA, conjunctiva normal, Neck supple, no lymphadenopathy, mucus membranes moist, no thyromegaly  Respiratory: breath sounds clear throughout, non-labored breathing  Cardiovascular: RRR, no MGR, no carotid bruits, radial and pedal pulses, no edema  GI:  non-distended, non-tender, soft, no liver edge, no masses  GU: no suprapubic tenderness  Neuro: A/O x4, speech normal, CN II-XII intact, no focal motor or sensory deficits  MSK: Full ROM in BUE and BLE  Skin: no rashes, warm, dry  Psychiatric: cooperative  Reproductive: deferred     ASSESSMENT and PLAN:  There are no diagnoses linked to this encounter.     No orders of the defined types were placed in this encounter.      No follow-ups on file.    {Time Documentation Optional:[id number]}

## 2022-08-07 ENCOUNTER — Encounter
Payer: MEDICAID | Attending: Obstetrics & Gynecology | Primary: Student in an Organized Health Care Education/Training Program

## 2022-08-07 ENCOUNTER — Encounter

## 2022-08-07 MED ORDER — LISINOPRIL 5 MG PO TABS
5 MG | ORAL_TABLET | Freq: Every morning | ORAL | 1 refills | Status: AC
Start: 2022-08-07 — End: ?

## 2022-08-23 ENCOUNTER — Inpatient Hospital Stay: Admit: 2022-08-23 | Payer: MEDICAID | Primary: Student in an Organized Health Care Education/Training Program

## 2022-08-23 DIAGNOSIS — Z1231 Encounter for screening mammogram for malignant neoplasm of breast: Secondary | ICD-10-CM

## 2022-09-04 ENCOUNTER — Ambulatory Visit: Admit: 2022-09-04 | Discharge: 2022-09-04 | Payer: MEDICAID | Attending: Obstetrics & Gynecology

## 2022-09-04 DIAGNOSIS — R232 Flushing: Secondary | ICD-10-CM

## 2022-09-04 MED ORDER — ESTRADIOL-NORETHINDRONE ACET 1-0.5 MG PO TABS
1-0.5 MG | ORAL_TABLET | Freq: Every day | ORAL | 3 refills | Status: AC
Start: 2022-09-04 — End: ?

## 2022-09-04 NOTE — Progress Notes (Signed)
Shannon Marshall (DOB:  12-21-1970) is a 52 y.o. female,Established patient, here for evaluation of the following chief complaint(s):  Follow-up (HRT med f/u )      Assessment & Plan   1. Hot flashes  -     estradiol-norethindrone (ACTIVELLA) 1-0.5 MG per tablet; Take 1 tablet by mouth daily, Disp-90 tablet, R-3Normal  Continue HRT  Dong well    Return in about 9 months (around 06/05/2023) for Spring.       Subjective   HPI  53 y/o presents for f/u for hot flashes. Currently on HRTS. Feeling much better    Review of Systems   Review of Systems - General ROS: negative  Respiratory ROS: no cough, shortness of breath, or wheezing  Cardiovascular ROS: no chest pain or dyspnea on exertion  Gastrointestinal ROS: no abdominal pain, change in bowel habits, or black or bloody stools  Genito-Urinary ROS: no dysuria, trouble voiding, or hematuria  Musculoskeletal ROS: negative      Objective   Blood pressure 124/73, height 1.676 m (5' 5.98"), weight 105.2 kg (232 lb).      On this date 09/04/2022 I have spent 20 minutes reviewing previous notes, test results and face to face with the patient discussing the diagnosis and importance of compliance with the treatment plan as well as documenting on the day of the visit.      An electronic signature was used to authenticate this note.    --Junius Finner, MD

## 2022-10-02 ENCOUNTER — Ambulatory Visit
Admit: 2022-10-02 | Discharge: 2022-10-02 | Payer: MEDICAID | Attending: Family | Primary: Student in an Organized Health Care Education/Training Program

## 2022-10-02 ENCOUNTER — Emergency Department: Admit: 2022-10-02 | Payer: MEDICAID | Primary: Student in an Organized Health Care Education/Training Program

## 2022-10-02 ENCOUNTER — Inpatient Hospital Stay
Admit: 2022-10-02 | Discharge: 2022-10-02 | Disposition: A | Discharge status: Home / Self Care | Payer: MEDICAID | Attending: Emergency Medicine

## 2022-10-02 DIAGNOSIS — H5702 Anisocoria: Secondary | ICD-10-CM

## 2022-10-02 DIAGNOSIS — G43909 Migraine, unspecified, not intractable, without status migrainosus: Secondary | ICD-10-CM

## 2022-10-02 MED ORDER — BUTALBITAL-APAP-CAFFEINE 50-300-40 MG PO CAPS
50-300-40 | ORAL_CAPSULE | Freq: Four times a day (QID) | ORAL | 0 refills | Status: AC | PRN
Start: 2022-10-02 — End: 2022-10-12

## 2022-10-02 NOTE — Patient Instructions (Signed)
Education attached to patient chart

## 2022-10-02 NOTE — Progress Notes (Signed)
Shannon Marshall is a 52 y.o. female who presents to the office today for the following:  Chief Complaint   Patient presents with    Headache     ON AND OFF, SUMATRIPTAN TAKEN APPROX 1 HOUR AGO  NOTICED VISION CHANGES AND LOOKED IN THE MIRROR AND NOTICED UNEQUAL PUPILS          Patient presents to clinic with c/o of intermittent headache without injury over the past 2 days and noticed dilated L pupil. She states that her vision is somewhat blurry but does not see colors and is not photophobic.  She denies n/v/d, cp, sob at this time. She also reports that she was handling her daughter's scopolamine patch today without gloves, around 1 hr ago, and feels that she may have touched her eyes. Took sumatriptan 1 hr prior to arrival. Denies drug use.       History provided by:  Patient  Language interpreter used: No    Headache  Headache pattern:  Headache sometimes there, sometimes not at all  Initial event:  None  Providers seen:  None  Abortive medications tried:  Sumatriptan         Allergies   Allergen Reactions    Penicillins Anaphylaxis and Swelling             Current Outpatient Medications   Medication Sig Dispense Refill    lisinopril (PRINIVIL;ZESTRIL) 5 MG tablet TAKE 1 TABLET BY MOUTH EVERY DAY IN THE MORNING 90 tablet 1    escitalopram (LEXAPRO) 5 MG tablet Take 1 tablet by mouth daily 90 tablet 3    SUMAtriptan (IMITREX) 50 MG tablet Take 50 mg by mouth at headache onset, repeat in >2 hrs for persistent headache. Do not exceed 100 mg/24 hrs. 9 tablet 5    Multiple Vitamins-Minerals (MULTIVITAMIN ADULTS PO) Take by mouth      Fexofenadine HCl (ALLEGRA ALLERGY PO) Take by mouth      fluticasone (FLONASE) 50 MCG/ACT nasal spray 2 sprays by Nasal route as needed for Allergies      estradiol-norethindrone (ACTIVELLA) 1-0.5 MG per tablet Take 1 tablet by mouth daily (Patient not taking: Reported on 10/02/2022) 90 tablet 3    estradiol (ESTRACE) 0.1 MG/GM vaginal cream Place 1 g vaginally three times a week  (Patient not taking: Reported on 07/03/2022) 40 g 3    ibuprofen (ADVIL;MOTRIN) 600 MG tablet Take 1 tablet by mouth every 6 hours as needed for Pain 20 tablet 0     No current facility-administered medications for this visit.        Past Medical History:   Diagnosis Date    Anxiety     Blood type, Rh negative     Gastroesophageal reflux disease without esophagitis 06/14/2021    Hypertension     Seasonal allergies     Sleep apnea       OB History       Gravida   5    Para   4    Term   3    Preterm   1    AB   1    Living   3         SAB   1    IAB        Ectopic        Molar        Multiple        Live Births   3  BP 122/88   Pulse 88   Temp 98.8 F (37.1 C)   Resp 16   Ht 1.676 m (5\' 6" )   Wt 105.2 kg (232 lb)      Social History     Tobacco Use    Smoking status: Never    Smokeless tobacco: Never   Vaping Use    Vaping Use: Never used   Substance Use Topics    Alcohol use: Never    Drug use: Never        Review of Systems   Neurological:  Positive for headaches.   All other systems reviewed and are negative.         Physical Exam  HENT:      Head: Normocephalic.   Eyes:      General: Lids are normal.      Pupils: Pupils are unequal.      Left eye: Pupil is not reactive.      Comments: ~30mm pupil size   Neurological:      Mental Status: She is alert.   Psychiatric:         Behavior: Behavior is cooperative.        BP 122/88   Pulse 88   Temp 98.8 F (37.1 C)   Resp 16   Ht 1.676 m (5\' 6" )   Wt 105.2 kg (232 lb)   SpO2 98%   BMI 37.45 kg/m      Past Surgical History:   Procedure Laterality Date    BREAST BIOPSY Right 2015    CESAREAN SECTION N/A     2008 2010 2013    COLONOSCOPY N/A 03/31/2021    COLONOSCOPY DIAGNOSTIC performed by Gwenlyn Found, MD at [facility] MAIN OR    DILATION AND CURETTAGE N/A 1997    SINUS SURGERY      WISDOM TOOTH EXTRACTION          No orders of the defined types were placed in this encounter.       No results found for any visits on 10/02/22.     1. Pupils of  different size  2. Acute nonintractable headache, unspecified headache type     Plan  Patient instructed to go to the ER at this time for further evaluation and imaging.  Due to patient narrative, it did not seem appropriate to treat headache without ruling out other possible etiologies of dilated L pupil.  Patient agreed with plan and states she will seek treatment at the [facility] ER.  Report given to [place], Charity fundraiser at [facility]   There are no Patient Instructions on file for this visit.     Return for Seek emergenct treatment, PRN, See treatment note.       Darryl Lent, APRN - NP

## 2022-10-02 NOTE — ED Provider Notes (Signed)
[facility] DE[facility]  EMERGENCY DEPARTMENT ENCOUNTER      Pt Name: Shannon Marshall  MRN: [id number]  Birthdate 1970/08/24  Date of evaluation: 10/02/2022  Provider: Ramond Marrow, MD    CHIEF COMPLAINT       Chief Complaint   Patient presents with    Migraine     C/o migraine x 2 days and noted dilated lft pupil as well. Unsure if she touched eye after removing daughters scopolamine and that may be the cause. Sent here from [facility] for CT         HISTORY OF PRESENT ILLNESS      Patient is a 52 year old female who presents today with migraines recurrently, but pupillary dilation.  Patient notes that she deals with these migraines quite often, has tried many different treatments recently, thought that it may be related to the perimenopause, was on estrogen therapy however this made it worse, stopped hormone replacement.  Has been trying sumatriptan, initially did have some improvement, however decreased efficacy while she was on hormone replacement.  Patient notes that her headaches over the last 2 days have been consistent with prior migraines.  No upper or lower extremity paresthesia, weakness, sensory deficit, no nausea vomiting, no significant change in vision, no difficulty with speech, no difficulty with ambulation.  However today she noted some very mild change in vision, realized that her left pupil was dilated.  Of note patient was handling her older daughters scopolamine patch earlier today, subsequently had symptoms of pupillary dilation noted on the left side.  Went to urgent care, they directed her here for further evaluation.  No head injury, no fever or chills.  No neck pain or rigidity.  No spinal manipulation.          Nursing Notes were reviewed.    REVIEW OF SYSTEMS       Review of Systems   Respiratory:  Negative for shortness of breath.    Cardiovascular:  Negative for chest pain.   Gastrointestinal:  Negative for abdominal pain, nausea and vomiting.   Neurological:  Positive for headaches.  Negative for weakness and numbness.       Except as noted above the remainder of the review of systems was reviewed and negative.       PAST MEDICAL HISTORY     Past Medical History:   Diagnosis Date    Anxiety     Blood type, Rh negative     Gastroesophageal reflux disease without esophagitis 06/14/2021    Hypertension     Seasonal allergies     Sleep apnea          SURGICAL HISTORY       Past Surgical History:   Procedure Laterality Date    BREAST BIOPSY Right 2015    CESAREAN SECTION N/A     2008 2010 2013    COLONOSCOPY N/A 03/31/2021    COLONOSCOPY DIAGNOSTIC performed by Gwenlyn Found, MD at [facility]    DILATION AND CURETTAGE N/A 1997    SINUS SURGERY      WISDOM TOOTH EXTRACTION           CURRENT MEDICATIONS       Discharge Medication List as of 10/02/2022  5:37 PM        CONTINUE these medications which have NOT CHANGED    Details   estradiol-norethindrone (ACTIVELLA) 1-0.5 MG per tablet Take 1 tablet by mouth daily, Disp-90 tablet, R-3Normal      lisinopril (PRINIVIL;ZESTRIL)  5 MG tablet TAKE 1 TABLET BY MOUTH EVERY DAY IN THE MORNING, Disp-90 tablet, R-1Normal      escitalopram (LEXAPRO) 5 MG tablet Take 1 tablet by mouth daily, Disp-90 tablet, R-3Normal      SUMAtriptan (IMITREX) 50 MG tablet Take 50 mg by mouth at headache onset, repeat in >2 hrs for persistent headache. Do not exceed 100 mg/24 hrs., Disp-9 tablet, R-5Normal      estradiol (ESTRACE) 0.1 MG/GM vaginal cream Place 1 g vaginally three times a week, Disp-40 g, R-3Normal      Multiple Vitamins-Minerals (MULTIVITAMIN ADULTS PO) Take by mouthHistorical Med      Fexofenadine HCl (ALLEGRA ALLERGY PO) Take by mouthHistorical Med      ibuprofen (ADVIL;MOTRIN) 600 MG tablet Take 1 tablet by mouth every 6 hours as needed for Pain, Disp-20 tablet, R-0Normal      fluticasone (FLONASE) 50 MCG/ACT nasal spray 2 sprays by Nasal route as needed for AllergiesHistorical Med             ALLERGIES     Penicillins    FAMILY HISTORY       Family History    Problem Relation Age of Onset    High Cholesterol Father     High Blood Pressure Father     Coronary Art Dis Father     Heart Attack Father     Alcohol Abuse Father     High Blood Pressure Brother     Breast Cancer Paternal Aunt     High Cholesterol Maternal Grandmother     High Blood Pressure Maternal Grandmother     Diabetes Maternal Grandmother     Stroke Maternal Grandmother     Diabetes Paternal Grandmother     Heart Disease Paternal Grandmother           SOCIAL HISTORY       Social History     Socioeconomic History    Marital status: Divorced   Tobacco Use    Smoking status: Never    Smokeless tobacco: Never   Vaping Use    Vaping Use: Never used   Substance and Sexual Activity    Alcohol use: Never    Drug use: Never       SCREENINGS         Glasgow Coma Scale  Eye Opening: Spontaneous  Best Verbal Response: Oriented  Best Motor Response: Obeys commands  Glasgow Coma Scale Score: 15                     CIWA Assessment  BP: 131/77  Pulse: 98                 PHYSICAL EXAM       ED Triage Vitals [10/02/22 1518]   BP Temp Temp Source Pulse Respirations SpO2 Height Weight - Scale   131/77 97.9 F (36.6 C) Oral 98 16 100 % 1.676 m (5' 5.98") 105.2 kg (231 lb 14.8 oz)       Physical Exam  Vitals and nursing note reviewed.   Constitutional:       General: She is not in acute distress.     Appearance: Normal appearance. She is not ill-appearing.   HENT:      Head: Normocephalic and atraumatic.      Right Ear: External ear normal.      Left Ear: External ear normal.      Nose: Nose normal.      Mouth/Throat:  Comments: Posterior oropharynx without any erythema or edema, no tonsillar exudate  Eyes:      General: No scleral icterus.     Extraocular Movements: Extraocular movements intact.      Conjunctiva/sclera: Conjunctivae normal.      Comments: Left pupil is dilated, is reactive to light, appropriate light response to the right eye as well   Cardiovascular:      Pulses: Normal pulses.      Comments: Normal  rate and rhythm, strong distal pulses, no abnormal heart sounds auscultated  Pulmonary:      Effort: Pulmonary effort is normal. No respiratory distress.      Breath sounds: No wheezing.      Comments: Respirations even and unlabored, clear breath sounds throughout, no tachypnea  Abdominal:      General: Abdomen is flat. There is no distension.      Palpations: Abdomen is soft.      Tenderness: There is no abdominal tenderness.      Comments: Abdomen soft, nontender to palpation throughout, no rebound or guarding   Musculoskeletal:         General: No deformity or signs of injury.      Cervical back: No rigidity.   Neurological:      General: No focal deficit present.      Mental Status: She is alert and oriented to person, place, and time. Mental status is at baseline.      Comments: Strength 5/5 in all extremities, distal sensation intact throughout, no clonus, no dysmetria, no cranial nerve deficit, ambulates without difficulty, no ataxia   Psychiatric:         Mood and Affect: Mood normal.         Behavior: Behavior normal.         DIAGNOSTIC RESULTS     RADIOLOGY:     Interpretation per the Radiologist below, if available at the time of this note:    CT HEAD WO CONTRAST   Final Result   No acute intracranial abnormality.          LABS:  Labs Reviewed - No data to display    All other labs were within normal range or not returned as of this dictation.    EMERGENCY DEPARTMENT COURSE and DIFFERENTIAL DIAGNOSIS/MDM:   Vitals:    Vitals:    10/02/22 1518   BP: 131/77   Pulse: 98   Resp: 16   Temp: 97.9 F (36.6 C)   TempSrc: Oral   SpO2: 100%   Weight: 105.2 kg (231 lb 14.8 oz)   Height: 1.676 m (5' 5.98")         Medical Decision Making  Patient is a 52 year old female who presents today with recurrent migraine, left uvular dilation.  Patient with stable vital signs on presentation today.  Physical exam as document above.  Patient nontoxic-appearing, neurologically intact outside of left pupillary dilation.  Did  handle scopolamine patch earlier today with symptoms subsequently developing.  Certainly fits picture for pupillary dilation with anticipate effective scopolamine on the left eye.  Patient presentation also considered for intracranial process.  CT head obtained.  Following negative CT head, discussed with neurology providers to evaluate consideration of additional imaging including CTA versus MRI.  On discussion with Dr. Dione Plover who is on-call for neurology, she notes that if she had considerable abnormality including aneurysm which would be the other etiology to consider, would be large enough that to have symptoms of this degree it should be  visible on Noncon CT head.  With this we will hold on additional cross-sectional, advanced imaging at this time.  Patient has sumatriptan at home, will trial Fioricet for migraine relief, offered medications here however patient states that she would like to be discharged home with trial of medications at that time.  Will provide neurology follow-up as an outpatient as she does not currently have a neurologist.  Patient otherwise stable for discharge home at this time.    Risk  Prescription drug management.          REASSESSMENT          PROCEDURES     Unless otherwise noted below, none     Procedures    FINAL IMPRESSION      1. Migraine without status migrainosus, not intractable, unspecified migraine type    2. Pupil dilation          DISPOSITION/PLAN   DISPOSITION Decision To Discharge 10/02/2022 05:34:59 PM      PATIENT REFERRED TO:  Philmore Pali, MD  [address]  [place] [address]  [phone]    Schedule an appointment as soon as possible for a visit   Reevaluation      DISCHARGE MEDICATIONS:  Discharge Medication List as of 10/02/2022  5:37 PM        START taking these medications    Details   butalbital-APAP-caffeine 50-300-40 MG CAPS per capsule Take 1 capsule by mouth every 6 hours as needed for Headaches, Disp-40 capsule, R-0Normal            Controlled Substances Monitoring:          No data to display                (Please note that portions of this note were completed with a voice recognition program.  Efforts were made to edit the dictations but occasionally words are mis-transcribed.)    Ramond Marrow, MD (electronically signed)  Attending Emergency Physician           Ramond Marrow, MD  10/02/22 1749

## 2022-10-02 NOTE — Discharge Instructions (Signed)
Your CT scan does not show any new injury today, your pupil dilation is likely related to scopolamine, touching eye    If you are to develop any other symptoms related to neurologic function including weakness, sensory loss, difficulty speaking, facial droop or any other features that are abnormal, please return to emergency care immediately for additional imaging    I have provided a new medication for treatment of your migraines at home, you may take this this evening    I have referred you to neurology provider for further evaluation of migraines as an outpatient    -Please return to medical care sooner if you are to develop any new or worsening symptoms including significant worsening of pain, new change in vision, numbness or tingling in the hands or feet, asymmetry of the face (facial droop), any sudden vomiting, new associated neck pain, fever/chills, or any other features you deem to be concerning

## 2022-10-06 ENCOUNTER — Telehealth

## 2022-10-06 NOTE — Telephone Encounter (Signed)
Called to reschedule pt's appt. Pt stated that left eye was "large and not going down" a few days ago so pt went to urgent care. From there pt was sent to [facility]. Pt says she is supposed to have referral to neurologist "Dr. Oval Linsey" and wants to make sure that was done.    Please advise

## 2022-10-09 NOTE — Telephone Encounter (Signed)
Patient inquiring about neurology referral.  ER visit states patient was referred to  PATIENT REFERRED TO:  Philmore Pali, MD  [address]  [place] [address]  [phone]

## 2022-10-10 NOTE — Addendum Note (Signed)
Addended by: Ervin Knack on: 10/10/2022 04:33 PM     Modules accepted: Orders

## 2022-10-10 NOTE — Telephone Encounter (Signed)
Referral sent 

## 2022-10-10 NOTE — Telephone Encounter (Signed)
Spoke with patient whom stated she did contact his office and was informed they have not received the referral and can't schedule her until the referral was received. Can we please place the referral to Dr. Oval Linsey again?

## 2022-11-01 ENCOUNTER — Encounter: Payer: MEDICAID | Attending: Student in an Organized Health Care Education/Training Program

## 2022-11-16 ENCOUNTER — Ambulatory Visit
Admit: 2022-11-16 | Discharge: 2022-11-16 | Payer: MEDICAID | Attending: Student in an Organized Health Care Education/Training Program

## 2022-11-16 VITALS — BP 130/80 | HR 71 | Ht 66.0 in | Wt 230.1 lb

## 2022-11-16 DIAGNOSIS — N951 Menopausal and female climacteric states: Secondary | ICD-10-CM

## 2022-11-16 MED ORDER — LISINOPRIL 5 MG PO TABS
5 MG | ORAL_TABLET | Freq: Every morning | ORAL | 1 refills | Status: DC
Start: 2022-11-16 — End: 2023-06-13

## 2022-11-16 MED ORDER — ESTRADIOL 0.1 MG/GM VA CREA
0.1 MG/GM | VAGINAL | 3 refills | Status: DC
Start: 2022-11-16 — End: 2023-06-13

## 2022-11-16 NOTE — Progress Notes (Signed)
 Date of Service: 11/16/2022  Patient: Shannon Marshall  DOB: Apr 15, 1970  Chief Complaint   Patient presents with    Annual Exam    Medication Refill     Would like to talk about lexapro         Patient Care Team:  Bary Corean Lever, APRN - NP as PCP - General (Nurse Practitioner)  Bary Corean Lever, APRN - NP as PCP - Empaneled Provider  Schwartzberg, Heather  S, MD as Consulting Physician (Obstetrics & Gynecology)  Wetzel Mitchell FALCON, MD as Consulting Physician (Obstetrics & Gynecology)    History of Present Illness:  Shannon Marshall a 52 y.o. female presents today for evaluation of the following issues:  Labs from 08/02/2022 still pending with last labs completed on 11/28/2021  Perimenopause:   Started HRT so reduce Lexapro  from 10 mg to 5 mg on 08/02/2022.stopped HRT due to headaches with no headaches for past two months. Pt wishes to stop Lexapro . Discussed benefit for vasomotor symptoms and insomnia. Pt will stay on Lexapro  5 mg for now.  HTN:  Pt has a history of hypertension  Lisinopril  5 mg. Has not had in past two days due to not picking up at [facility].    Pt's antihypertensive medication listed in med list.   Pt is advised to have eye exams yearly and screening for microalbuminuria at least once a year.  Pt denies CP, SOB, DOE, improved headaches, vision changes, and admits to peripheral edema.  Home monitoring of blood pressure results:   Lab Results   Component Value Date    NA 137 11/28/2021    K 4.2 11/28/2021    CL 102 11/28/2021    CO2 24 11/28/2021       Lab Results   Component Value Date    CREATININE 0.7 11/28/2021    BUN 14 11/28/2021    NA 137 11/28/2021    K 4.2 11/28/2021    CL 102 11/28/2021    CO2 24 11/28/2021          Current Outpatient Medications   Medication Sig Dispense Refill    [START ON 11/17/2022] estradiol  (ESTRACE ) 0.1 MG/GM vaginal cream Place 1 g vaginally Every Monday, Wednesday, and Friday 40 g 3    lisinopril  (PRINIVIL ;ZESTRIL ) 5 MG tablet Take 1 tablet by mouth every  morning 90 tablet 1    escitalopram  (LEXAPRO ) 5 MG tablet Take 1 tablet by mouth daily 90 tablet 3    SUMAtriptan  (IMITREX ) 50 MG tablet Take 50 mg by mouth at headache onset, repeat in >2 hrs for persistent headache. Do not exceed 100 mg/24 hrs. 9 tablet 5    Multiple Vitamins-Minerals (MULTIVITAMIN ADULTS PO) Take by mouth      Fexofenadine HCl (ALLEGRA ALLERGY PO) Take by mouth      fluticasone  (FLONASE) 50 MCG/ACT nasal spray 2 sprays by Nasal route as needed for Allergies       No current facility-administered medications for this visit.       Allergies   Allergen Reactions    Penicillins Anaphylaxis and Swelling            Patient Active Problem List   Diagnosis    Perimenopause    Metrorrhagia    Mixed stress and urge urinary incontinence    Urinary hesitancy    Irregular menses    Obesity, unspecified    Precordial pain    Essential hypertension    Acute bacterial sinusitis    Gastroesophageal reflux disease without esophagitis  Abnormal ECG    Hypertensive heart disease without heart failure     Past Medical History:   Diagnosis Date    Anxiety     Blood type, Rh negative     Gastroesophageal reflux disease without esophagitis 06/14/2021    Hypertension     Seasonal allergies     Sleep apnea      Past Surgical History:   Procedure Laterality Date    BREAST BIOPSY Right 2015    CESAREAN SECTION N/A     2008 2010 2013    COLONOSCOPY N/A 03/31/2021    COLONOSCOPY DIAGNOSTIC performed by Curtistine CHRISTELLA Pearl, MD at [facility]    DILATION AND CURETTAGE N/A 1997    SINUS SURGERY      WISDOM TOOTH EXTRACTION       Family History   Problem Relation Age of Onset    High Cholesterol Father     High Blood Pressure Father     Coronary Art Dis Father     Heart Attack Father     Alcohol Abuse Father     High Blood Pressure Brother     Breast Cancer Paternal Aunt     High Cholesterol Maternal Grandmother     High Blood Pressure Maternal Grandmother     Diabetes Maternal Grandmother     Stroke Maternal Grandmother      Diabetes Paternal Grandmother     Heart Disease Paternal Grandmother      Social History     Tobacco Use    Smoking status: Never    Smokeless tobacco: Never   Substance Use Topics    Alcohol use: Never     Social History     Social History Narrative    Not on file         ROS:    Review of Systems    See HPI, all other ROS are negative         BP 130/80 (Site: Left Upper Arm, Position: Sitting)   Pulse 71   Ht 1.676 m (5' 6)   Wt 104.4 kg (230 lb 1.6 oz)   SpO2 95%   BMI 37.14 kg/m     General: conversive, pleasant, awake, alert   HEENT: conjunctiva normal, Neck supple, no lymphadenopathy, mucus membranes moist, no thyromegaly  Respiratory: breath sounds clear throughout, non-labored breathing  Cardiovascular: RRR, no MGR, no carotid bruits, radial and pedal pulses, no edema  Neuro: A/O x4, speech normal, CN II-XII intact, no focal motor or sensory deficits  MSK: Full ROM in BUE and BLE  Skin: no rashes, warm, dry  Psychiatric: cooperative  Reproductive: deferred     ASSESSMENT and PLAN:  1. Perimenopause  - estradiol  (ESTRACE ) 0.1 MG/GM vaginal cream; Place 1 g vaginally Every Monday, Wednesday, and Friday  Dispense: 40 g; Refill: 3    2. Essential hypertension  - lisinopril  (PRINIVIL ;ZESTRIL ) 5 MG tablet; Take 1 tablet by mouth every morning  Dispense: 90 tablet; Refill: 1       No orders of the defined types were placed in this encounter.    Please have 8 hour fasting labs previously ordered done at a [facility] 2-7 days before follow up.   Return in about 4 weeks (around 12/14/2022) for HTN med check, lab review.

## 2022-12-13 ENCOUNTER — Encounter

## 2022-12-13 ENCOUNTER — Ambulatory Visit
Admit: 2022-12-13 | Discharge: 2022-12-13 | Payer: MEDICAID | Attending: Student in an Organized Health Care Education/Training Program

## 2022-12-13 DIAGNOSIS — Z23 Encounter for immunization: Secondary | ICD-10-CM

## 2022-12-13 LAB — CBC WITH AUTO DIFFERENTIAL
Basophils %: 0.9 % (ref 0.0–2.0)
Basophils Absolute: 0.1 10*3/uL (ref 0.0–0.2)
Eosinophils %: 2.1 % (ref 0.0–7.0)
Eosinophils Absolute: 0.2 10*3/uL (ref 0.0–0.5)
Hematocrit: 37.9 % (ref 34.0–47.0)
Hemoglobin: 13.1 g/dL (ref 11.5–15.7)
Immature Grans (Abs): 0.1 10*3/uL — ABNORMAL HIGH (ref 0.00–0.06)
Immature Granulocytes %: 1.3 % — ABNORMAL HIGH (ref 0.0–0.6)
Lymphocytes Absolute: 2 10*3/uL (ref 1.0–3.2)
Lymphocytes: 25.8 % (ref 15.0–45.0)
MCH: 31 pg (ref 27.0–34.5)
MCHC: 34.6 g/dL (ref 30.0–36.0)
MCV: 89.6 fL (ref 81.0–99.0)
MPV: 9.9 fL (ref 7.0–12.2)
Monocytes %: 4.5 % (ref 4.0–12.0)
Monocytes Absolute: 0.3 10*3/uL (ref 0.3–1.0)
NRBC Absolute: 0 10*3/uL (ref 0.000–0.012)
NRBC Automated: 0 % (ref 0.0–0.2)
Neutrophils %: 65.4 % (ref 42.0–74.0)
Neutrophils Absolute: 4.9 10*3/uL (ref 1.6–7.3)
Platelets: 281 10*3/uL (ref 140–440)
RBC: 4.23 x10e6/mcL (ref 3.60–5.20)
RDW: 12.9 % (ref 10.0–17.0)
WBC: 7.6 10*3/uL (ref 3.8–10.6)

## 2022-12-13 LAB — COMPREHENSIVE METABOLIC PANEL
ALT: 23 U/L (ref 0–35)
AST: 24 U/L (ref 0–35)
Albumin/Globulin Ratio: 1.2 (ref 1.00–2.70)
Albumin: 3.7 g/dL (ref 3.5–5.2)
Alk Phosphatase: 75 U/L (ref 35–117)
Anion Gap: 13 mmol/L (ref 2–17)
BUN: 15 mg/dL (ref 6–20)
CO2: 23 mmol/L (ref 22–29)
Calcium: 9.3 mg/dL (ref 8.5–10.7)
Chloride: 104 mmol/L (ref 98–107)
Creatinine: 0.8 mg/dL (ref 0.5–1.0)
Est, Glom Filt Rate: 89 mL/min/1.73mÂ² (ref >=60)
Globulin: 3 g/dL (ref 1.9–4.4)
Glucose: 102 mg/dL — ABNORMAL HIGH (ref 70–99)
Osmolaliy Calculated: 280 mosm/kg (ref 270–287)
Potassium: 4.5 mmol/L (ref 3.5–5.3)
Sodium: 140 mmol/L (ref 135–145)
Total Bilirubin: 0.19 mg/dL (ref 0.00–1.20)
Total Protein: 6.7 g/dL (ref 5.7–8.3)

## 2022-12-13 LAB — LIPID PANEL
Chol/HDL Ratio: 5.2 — ABNORMAL HIGH (ref 0.0–4.4)
Cholesterol, Total: 146 mg/dL (ref 100–200)
HDL: 28 mg/dL — ABNORMAL LOW (ref >=50)
LDL Cholesterol: 46.2 mg/dL (ref 0.0–100.0)
LDL/HDL Ratio: 1.7
Triglycerides: 359 mg/dL — ABNORMAL HIGH (ref 0–149)
VLDL: 71.8 mg/dL — ABNORMAL HIGH (ref 5.0–40.0)

## 2022-12-13 LAB — HEMOGLOBIN A1C
Estimated Avg Glucose: 105
Estimated Avg Glucose: 111
Hemoglobin A1C: 5.3 % (ref 4.0–6.0)

## 2022-12-13 LAB — VITAMIN D 25 HYDROXY: Vit D, 25-Hydroxy: 33 ng/mL (ref 30.0–90.0)

## 2022-12-13 LAB — TSH REFLEX TO FT4: TSH: 1.8 u[IU]/mL (ref 0.358–3.740)

## 2022-12-13 MED ORDER — AZELASTINE-FLUTICASONE 137-50 MCG/ACT NA SUSP
137-50 | Freq: Two times a day (BID) | NASAL | 0 refills | Status: DC
Start: 2022-12-13 — End: 2023-08-24

## 2022-12-13 MED ORDER — ICOSAPENT ETHYL 1 G PO CAPS
1 g | ORAL_CAPSULE | Freq: Two times a day (BID) | ORAL | 3 refills | Status: DC
Start: 2022-12-13 — End: 2023-06-13

## 2022-12-13 MED ORDER — OXYMETAZOLINE HCL 0.05 % NA SOLN
0.05 % | Freq: Every day | NASAL | 3 refills | Status: DC | PRN
Start: 2022-12-13 — End: 2023-08-24

## 2022-12-13 MED ORDER — BLOOD PRESSURE MONITOR DEVI
0 refills | Status: AC | PRN
Start: 2022-12-13 — End: ?

## 2022-12-13 NOTE — Patient Instructions (Signed)
 Allergy, Bronchitis, URI, Sinusitis, Pharyngitis, Pneumonia, and Rhinitis Treatment  -Treatment of these complex symptoms include trigger avoidance, medications, and/or nasal rinsing irrigation, optimizing your body's defenses. Antibiotics are far less effective, have more risks, and should be used rarely.   Trigger avoidance-Reduce exposure to tobacco smoke, pollutants, and irritants like perfumes, scented products, and keeping pets out of room. Dust room weekly to include ceiling fans, doors, walls, and furniture. Change air filters regularly. Use a mattress cover to protect against dust mites.   Antihistamine-Xyzal, Zyrtec, Claritin, or Allegra in the morning and Benadryl (Diphenhydramine) at bedtime (no Benadryl >65 years age). Use for 30 days at onset of symptoms.   Nasal decongestants-Afrin or Neo-synephrine. Should not be used for more than 3 days at a time due to rebound rhinitis. Use in each side, wait one minute, then repeat. Do sequence twice daily for 3 days. Avoid decongestants if patient has history of high blood pressure.   Nasal rinsing and irrigation-Nasal Isotonic Saline Neilmed soulution twice or more times a day for 30 days from onset of symptoms. Nasal rinsing should be done before use of nasal medication. Use distilled water only and mix according to instructions with prepared solution. Irrigation pots and bottle sprayers may be used for nasal irrigation. Prepared saline can also be used.   Nasal glucocorticoid-A nasal glucocorticoid, fluticasone  (ie. Flonase, Veramyst, Nasocort, etc). Use one spray in each nostril twice per day for at least 30 days. How to use a nasal spray: hold one nostril closed with a finger, position head with chin slightly tucked, direct spray away from the nasal septum (cartilage that divides the two sides of the nose), dispense and then sniff in slightly to pull into higher parts of the nose. (Sniffing to hard will result in medicine draining down the throat, so  avoid). Do not blow nose for at least 5 minutes after medication administration.   Antitussives aka cough suppressants for dry cough-Tessalon Perles, Delsym, Robitussin DM, Mucinex DM, Nyquil. Best to use at night.   Expectorants-Guaifenesin generic for Robitussin and Mucinex. Best to use during day. Help to rid body of mucus.   Fluids-One plus gallon daily of clear liquid with at least 1/2 total in water. Avoid dairy like milk, ice cream, cheese. Yogurt may be beneficial sparingly. Dairy can cause increased mucus and throat irritation.    Return to care if symptoms worsen, fever greater than 101 F, SOB or CP or continue past 7-10 days.   Misc.: Bruder mask/warm compress is helpful to open clogged ducts in eyes to improve eye blurriness from allergies.Cold compresses are helpful for swollen eyes and dark circles.

## 2022-12-13 NOTE — Progress Notes (Unsigned)
Date of Service: 12/12/2022  Patient: Shannon Marshall  DOB: 1970-08-14  No chief complaint on file.      Patient Care Team:  Loanne Drilling, DO as PCP - General (Internal Medicine)  Loanne Drilling, DO as PCP - Empaneled Provider  Schwartzberg, Ethel Rana, MD as Consulting Physician (Obstetrics & Gynecology)  Junius Finner, MD as Consulting Physician (Obstetrics & Gynecology)    History of Present Illness:  Shannon Marshall a 52 y.o. female presents today for evaluation of the following issues:  Labs ordered 08/02/2022 are pending to include TSH, vitamin D, A1c, lipid, CMP, CBC, microalbumin urine.  Last completed labs in September 2023  Hypertension follow-up:  Home blood pressure monitoring results:  Medication change at last visit: Lisinopril 5 mg    Perimenopause:  Prescribed vaginal estrogen on 11/16/2022.  Patient did not tolerate oral HRT.  Also prescribed Lexapro 5 mg to help with vasomotor symptoms  Current Outpatient Medications   Medication Sig Dispense Refill    estradiol (ESTRACE) 0.1 MG/GM vaginal cream Place 1 g vaginally Every Monday, Wednesday, and Friday 40 g 3    lisinopril (PRINIVIL;ZESTRIL) 5 MG tablet Take 1 tablet by mouth every morning 90 tablet 1    escitalopram (LEXAPRO) 5 MG tablet Take 1 tablet by mouth daily 90 tablet 3    SUMAtriptan (IMITREX) 50 MG tablet Take 50 mg by mouth at headache onset, repeat in >2 hrs for persistent headache. Do not exceed 100 mg/24 hrs. 9 tablet 5    Multiple Vitamins-Minerals (MULTIVITAMIN ADULTS PO) Take by mouth      Fexofenadine HCl (ALLEGRA ALLERGY PO) Take by mouth      fluticasone (FLONASE) 50 MCG/ACT nasal spray 2 sprays by Nasal route as needed for Allergies       No current facility-administered medications for this visit.       Allergies   Allergen Reactions    Penicillins Anaphylaxis and Swelling            Patient Active Problem List   Diagnosis    Perimenopause    Metrorrhagia    Mixed stress and urge urinary incontinence    Urinary  hesitancy    Irregular menses    Obesity, unspecified    Precordial pain    Essential hypertension    Acute bacterial sinusitis    Gastroesophageal reflux disease without esophagitis    Abnormal ECG    Hypertensive heart disease without heart failure     Past Medical History:   Diagnosis Date    Anxiety     Blood type, Rh negative     Gastroesophageal reflux disease without esophagitis 06/14/2021    Hypertension     Seasonal allergies     Sleep apnea      Past Surgical History:   Procedure Laterality Date    BREAST BIOPSY Right 2015    CESAREAN SECTION N/A     2008 2010 2013    COLONOSCOPY N/A 03/31/2021    COLONOSCOPY DIAGNOSTIC performed by Gwenlyn Found, MD at [facility]    DILATION AND CURETTAGE N/A 1997    SINUS SURGERY      WISDOM TOOTH EXTRACTION       Family History   Problem Relation Age of Onset    High Cholesterol Father     High Blood Pressure Father     Coronary Art Dis Father     Heart Attack Father     Alcohol Abuse Father  High Blood Pressure Brother     Breast Cancer Paternal Aunt     High Cholesterol Maternal Grandmother     High Blood Pressure Maternal Grandmother     Diabetes Maternal Grandmother     Stroke Maternal Grandmother     Diabetes Paternal Grandmother     Heart Disease Paternal Grandmother      Social History     Tobacco Use    Smoking status: Never    Smokeless tobacco: Never   Substance Use Topics    Alcohol use: Never     Social History     Social History Narrative    Not on file         ROS:    Review of Systems    See HPI, all other ROS are negative         There were no vitals taken for this visit.    General: conversive, pleasant, awake, alert   HEENT: TM pearly grey and intact without excessive cerumen bilaterally, PERRLA, conjunctiva normal, Neck supple, no lymphadenopathy, mucus membranes moist, no thyromegaly  Respiratory: breath sounds clear throughout, non-labored breathing  Cardiovascular: RRR, no MGR, no carotid bruits, radial and pedal pulses, no edema  GI:  non-distended, non-tender, soft, no liver edge, no masses  GU: no suprapubic tenderness  Neuro: A/O x4, speech normal, CN II-XII intact, no focal motor or sensory deficits  MSK: Full ROM in BUE and BLE  Skin: no rashes, warm, dry  Psychiatric: cooperative  Reproductive: deferred     ASSESSMENT and PLAN:  There are no diagnoses linked to this encounter.     No orders of the defined types were placed in this encounter.      No follow-ups on file.    {Time Documentation Optional:[id number]}

## 2022-12-14 LAB — MICROALBUMIN, UR: Albumin Urine: <0.3 mg/dL (ref 0.0–20.0)

## 2023-02-08 ENCOUNTER — Encounter: Admit: 2023-02-08 | Admitting: Student in an Organized Health Care Education/Training Program

## 2023-02-08 DIAGNOSIS — R519 Headache, unspecified: Secondary | ICD-10-CM

## 2023-02-08 MED ORDER — SUMATRIPTAN SUCCINATE 50 MG PO TABS
50 MG | ORAL_TABLET | ORAL | 5 refills | Status: DC
Start: 2023-02-08 — End: 2023-08-22

## 2023-02-08 NOTE — Telephone Encounter (Signed)
 Requested Prescriptions     Pending Prescriptions Disp Refills    SUMAtriptan (IMITREX) 50 MG tablet [Pharmacy Med Name: SUMATRIPTAN SUCC 50 MG TABLET] 9 tablet 5     Sig: Take 50 mg by mouth at headache onset, repeat in >2 hrs for persistent headache. Do

## 2023-04-26 ENCOUNTER — Ambulatory Visit: Admit: 2023-04-26 | Discharge: 2023-04-26 | Payer: MEDICAID | Attending: Neurology

## 2023-04-26 VITALS — BP 137/79 | HR 90 | Ht 66.0 in | Wt 220.0 lb

## 2023-04-26 DIAGNOSIS — G43709 Chronic migraine without aura, not intractable, without status migrainosus: Secondary | ICD-10-CM
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
## 2023-04-26 NOTE — Progress Notes (Signed)
Patient is seen in office today as New Patient with Migraines.    Pt stated she was recently put on a new Hypertension medication and has been experiencing Migraines and New Issues. Ringing in Ears,tingling in feet,Blurred Vision, and Buzzing in the Body.    Pt stated she has about 2-3 Migraines per week and she is uncertain if that the same one lingering or 2

## 2023-05-01 NOTE — Telephone Encounter (Signed)
 Spoke with pt and got her scheduled for next week.

## 2023-05-03 ENCOUNTER — Encounter

## 2023-05-08 ENCOUNTER — Encounter: Payer: MEDICAID | Attending: Neurology

## 2023-05-08 NOTE — Telephone Encounter (Signed)
 Patient is asking to reschedule due to her kids being sick. Please advise.

## 2023-05-08 NOTE — Telephone Encounter (Signed)
 Spoke with pt and got her rescheduled for next week

## 2023-05-16 ENCOUNTER — Encounter: Payer: MEDICAID | Attending: Neurology

## 2023-06-05 ENCOUNTER — Ambulatory Visit: Admit: 2023-06-05 | Discharge: 2023-06-05 | Payer: MEDICAID | Attending: Neurology

## 2023-06-05 DIAGNOSIS — R202 Paresthesia of skin: Secondary | ICD-10-CM

## 2023-06-05 NOTE — Progress Notes (Signed)
 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  No evidence of left/right lumbar radiculopathy.

## 2023-06-11 NOTE — Telephone Encounter (Signed)
 Shanda Bumps called stated the [facility] is no more that it is now [facility] and they need a new referral for the pt faxed to them at [phone] .    PLEASE ASSIST

## 2023-06-12 NOTE — Telephone Encounter (Signed)
MyChart clean up

## 2023-06-13 ENCOUNTER — Encounter: Attending: Obstetrics & Gynecology

## 2023-06-13 ENCOUNTER — Ambulatory Visit
Admit: 2023-06-13 | Discharge: 2023-06-13 | Payer: MEDICAID | Attending: Student in an Organized Health Care Education/Training Program

## 2023-06-13 VITALS — BP 130/72 | HR 92 | Temp 98.20000°F | Ht 66.0 in | Wt 232.0 lb

## 2023-06-13 DIAGNOSIS — I10 Essential (primary) hypertension: Secondary | ICD-10-CM

## 2023-06-13 MED ORDER — TIRZEPATIDE-WEIGHT MANAGEMENT 2.5 MG/0.5ML SC SOAJ
2.5 | SUBCUTANEOUS | 0 refills | 28.00000 days | Status: DC
Start: 2023-06-13 — End: 2023-08-24

## 2023-06-13 MED ORDER — ESTRADIOL 0.1 MG/GM VA CREA
0.1 | VAGINAL | 3 refills | Status: DC
Start: 2023-06-13 — End: 2024-02-07

## 2023-06-13 MED ORDER — LISINOPRIL 5 MG PO TABS
5 | ORAL_TABLET | Freq: Every morning | ORAL | 1 refills | Status: DC
Start: 2023-06-13 — End: 2024-01-10

## 2023-06-13 NOTE — Progress Notes (Signed)
 Date of Service: 06/13/2023  Patient: Shannon Marshall  DOB: 07/08/1970  Chief Complaint   Patient presents with    Follow-up    Hypertension       Patient Care Team:  Loanne Drilling, DO as PCP - General (Internal Medicine)  Loanne Drilling, DO as PCP - Empaneled Provider  Schwartzberg, Ethel Rana, MD as Consulting Physician (Obstetrics & Gynecology)  Junius Finner, MD as Consulting Physician (Obstetrics & Gynecology)  Oval Linsey Darrell Jewel, MD as Consulting Physician (Neurology)  Psych with [facility]-presribed Wellbutrin  Erma Heritage    History of Present Illness:  Shannon Marshall a 53 y.o. female presents today for evaluation of the following issues:  Headaches:  Has has 2 headaches in past month. Imitrex is effective. Was increased by neurology.   Weight loss   Diets/programs tried: various  Weight loss medications: none  Start date: 06/13/23  Goal weight:  Weight at medication or weight loss plan start: 232 lbs  BMI at medication or weight loss plan start 37.45  Medication prescribed: zepbound today. Unsure if insurance covers weight loss meds. Can not afford cash price. Is a single mom of a disabled child.   Side effect:  Last A1c:  Weight change since last visit: tbd lbs  Current weight:tbd lbs  Current BMI tbd  Total weight loss:tbd lbs    Food diary: Pt encouraged to download a free app on phone to track calories and quality of food    Meal plan compliance: TBD calories per day. Reduce daily calories by 500 per day to lose approximately 1 lb a week.   Protein per day should be 1 gram per kg of body weight. Ex. 70 kg person would need 70 grams of protein per day.      Daily water intake in ounces should be 1 ounce per half the body weight in lbs. Ex. 180 lb person would need 90 ounces.     Physical activity:at least 30 mins of moderate intensity (when you can walk and talk but not walk and sing) no less than 5 days a week.       HTN:  Pt has a history of hypertension  Pt is   compliant with taking medication.  Pt's antihypertensive medication listed in med list.   Pt is advised to have eye exams yearly and screening for microalbuminuria at least once a year.  Pt denies CP, SOB, DOE, headaches, vision changes, and admits to improved peripheral edema.  Home monitoring of blood pressure results:   Lab Results   Component Value Date    NA 140 12/13/2022    K 4.5 12/13/2022    CL 104 12/13/2022    CO2 23 12/13/2022       Lab Results   Component Value Date    CREATININE 0.8 12/13/2022    BUN 15 12/13/2022    NA 140 12/13/2022    K 4.5 12/13/2022    CL 104 12/13/2022    CO2 23 12/13/2022    HYPERLIPIDEMIA:  Pt with a history of hyperlipidemia and is not taking cholesterol lowering medication.  Review with pt in detail low cholesterol and low fat diet, review importance of weight loss (if needed) and exercise plan.   Pt denies any myalgias or abdominal discomfort. Was prescribed Vascepa but not covered. Due to confusion of social media, patient did not start an otc fish oil. Discussed taking 2 g a day.  The 10-year ASCVD risk score (Arnett DK, et  al., 2019) is: 2.8%    Values used to calculate the score:      Age: 53 years      Sex: Female      Is Non-Hispanic African American: No      Diabetic: No      Tobacco smoker: No      Systolic Blood Pressure: 130 mmHg      Is BP treated: Yes      HDL Cholesterol: 28 mg/dL      Total Cholesterol: 146 mg/dL   Lab Results   Component Value Date    CHOL 146 12/13/2022     Lab Results   Component Value Date    TRIG 359 (H) 12/13/2022     Lab Results   Component Value Date    HDL 28 (L) 12/13/2022     No components found for: "LDLCHOLESTEROL", "LDLCALC"         Current Outpatient Medications   Medication Sig Dispense Refill    prazosin (MINIPRESS) 1 MG capsule TAKE 1 CAPSULE BY MOUTH AT BEDTIME- MAY LOWER BLOOD PRESSURE      buPROPion (WELLBUTRIN SR) 150 MG extended release tablet Take 1 tablet by mouth every morning      vitamin D 25 MCG (1000 UT) CAPS Take  by mouth      estradiol (ESTRACE) 0.1 MG/GM vaginal cream Place 1 g vaginally Every Monday, Wednesday, and Friday 40 g 3    tirzepatide-weight management (ZEPBOUND) 2.5 MG/0.5ML SOAJ subCUTAneous auto-injector pen Inject 2.5 mg into the skin every 7 days 2 mL 0    lisinopril (PRINIVIL;ZESTRIL) 5 MG tablet Take 1 tablet by mouth every morning 90 tablet 1    SUMAtriptan (IMITREX) 50 MG tablet TAKE 50 MG BY MOUTH AT HEADACHE ONSET, REPEAT IN >2 HRS FOR PERSISTENT HEADACHE. DO NOT EXCEED 100 MG/24 HRS. (Patient taking differently: Take 2 tablets by mouth once as needed Take 100 mg by mouth at headache onset, repeat in >2 hrs for persistent headache. Do not exceed 200 mg/24 hrs.) 9 tablet 5    Azelastine-Fluticasone 137-50 MCG/ACT SUSP 1 spray by Nasal route in the morning and at bedtime 1 each 0    Blood Pressure Monitor DEVI 1 each by Does not apply route as needed (hypertension) 1 each 0    Fexofenadine HCl (ALLEGRA ALLERGY PO) Take by mouth      fluticasone (FLONASE) 50 MCG/ACT nasal spray 2 sprays by Nasal route as needed for Allergies      oxymetazoline (12 HOUR NASAL SPRAY) 0.05 % nasal spray 1 spray by Nasal route daily as needed for Congestion (no more than 3 days at a time) (Patient not taking: Reported on 06/13/2023) 1 each 3     No current facility-administered medications for this visit.       Allergies   Allergen Reactions    Penicillins Anaphylaxis and Swelling            Patient Active Problem List   Diagnosis    Perimenopause    Metrorrhagia    Mixed stress and urge urinary incontinence    Urinary hesitancy    Irregular menses    Obesity, unspecified    Precordial pain    Essential hypertension    Acute bacterial sinusitis    Gastroesophageal reflux disease without esophagitis    Abnormal ECG    Hypertensive heart disease without heart failure    Chronic migraine without aura without status migrainosus, not intractable    Paresthesias  Past Medical History:   Diagnosis Date    Anxiety     Blood type, Rh  negative     Gastroesophageal reflux disease without esophagitis 06/14/2021    Hypertension     Migraine     Seasonal allergies     Sleep apnea      Past Surgical History:   Procedure Laterality Date    BREAST BIOPSY Right 2015    CESAREAN SECTION N/A     2008 2010 2013    COLONOSCOPY N/A 03/31/2021    COLONOSCOPY DIAGNOSTIC performed by Gwenlyn Found, MD at [facility] MAIN OR    DILATION AND CURETTAGE N/A 1997    SINUS SURGERY      WISDOM TOOTH EXTRACTION       Family History   Problem Relation Age of Onset    High Cholesterol Father     High Blood Pressure Father     Coronary Art Dis Father     Heart Attack Father     Alcohol Abuse Father     High Blood Pressure Brother     Breast Cancer Paternal Aunt     High Cholesterol Maternal Grandmother     High Blood Pressure Maternal Grandmother     Diabetes Maternal Grandmother     Stroke Maternal Grandmother     Diabetes Paternal Grandmother     Heart Disease Paternal Grandmother      Social History     Tobacco Use    Smoking status: Never    Smokeless tobacco: Never   Substance Use Topics    Alcohol use: Never     Social History     Social History Narrative    Not on file         ROS:    Review of Systems    See HPI, all other ROS are negative         BP 130/72   Pulse 92   Temp 98.2 F (36.8 C)   Ht 1.676 m (5\' 6" )   Wt 105.2 kg (232 lb)   SpO2 97%   BMI 37.45 kg/m     General: conversive, pleasant, awake, alert   HEENT:  conjunctiva normal, Neck supple, no lymphadenopathy, mucus membranes moist, no thyromegaly  Respiratory: breath sounds clear throughout, non-labored breathing  Cardiovascular: RRR, no MGR, no carotid bruits, radial and pedal pulses, no edema  GI: non-distended, non-tender, soft, no liver edge, no masses  GU: no suprapubic tenderness  Neuro: A/O x4, speech normal, CN II-XII intact, no focal motor or sensory deficits  MSK: Full ROM in BUE and BLE  Skin: no rashes, warm, dry  Psychiatric: cooperative  Reproductive: deferred     ASSESSMENT and  PLAN:  1. Essential hypertension    - CBC with Auto Differential; Future  - Comprehensive Metabolic Panel; Future  - tirzepatide-weight management (ZEPBOUND) 2.5 MG/0.5ML SOAJ subCUTAneous auto-injector pen; Inject 2.5 mg into the skin every 7 days  Dispense: 2 mL; Refill: 0  -refilled lisinopril 5 mg  2. Mixed hyperlipidemia    - CBC with Auto Differential; Future  - Lipid Panel; Future  - tirzepatide-weight management (ZEPBOUND) 2.5 MG/0.5ML SOAJ subCUTAneous auto-injector pen; Inject 2.5 mg into the skin every 7 days  Dispense: 2 mL; Refill: 0    3. Need for shingles vaccine    - Zoster, SHINGRIX, (50 yrs +), IM    4. Perimenopause    - estradiol (ESTRACE) 0.1 MG/GM vaginal cream; Place 1 g vaginally Every Monday,  Wednesday, and Friday  Dispense: 40 g; Refill: 3    5. Need for pneumococcal 20-valent conjugate vaccination    - Pneumococcal, PCV20, PREVNAR 20, (age 6w+), IM, PF    6. Obesity (BMI 35.0-39.9 without comorbidity)    - tirzepatide-weight management (ZEPBOUND) 2.5 MG/0.5ML SOAJ subCUTAneous auto-injector pen; Inject 2.5 mg into the skin every 7 days  Dispense: 2 mL; Refill: 0    7. Weight loss counseling, encounter for    - tirzepatide-weight management (ZEPBOUND) 2.5 MG/0.5ML SOAJ subCUTAneous auto-injector pen; Inject 2.5 mg into the skin every 7 days  Dispense: 2 mL; Refill: 0     Weight loss instructions  - We'll begin patient on a higher protein, lower carbohydrate meal plan.   -Discuss with patient goal for BMI and the health risks of Obesity including but not limited to diabetes, insulin resistance, hypertension, hyperlipidemia, heart disease, stroke, arthritis, sleep apnea, and some cancers.   -Discuss with a weight loss plan to consume less calories than patient burns per day. Assist patient in setting a safe total daily calorie goal. Advise to eat small frequent meals and discuss serving size. Discuss food choices- avoid processed foods and foods and drinks high in carbohydrates/sugars.  Patient encouraged to consume 3 meals a day with limited snacking.   -Based on the measured caloric expenditure and current weight, the meal plan will include approximately 1g protein/kg body weight. Protein goal is 100  grams of protein per day.  - Patient may have one serving of starch per meal with serving size of  1/2  cup. Patient may have up to 2 fruit servings a day.   Patient may have unlimited nonstarchy vegetables per day.  - Patient is encouraged to drink 80-100 ounces of water per day with a minimum of 64 ounces per day.  No eating and drinking at the same time.  - Patient is asked to complete a food journal whether this is myfitnesspal, [employer] weight loss app, or another of the patient's choosing.  - Labs today:needs to complete labs Further treatment based on lab results.  - Encouraged patient to increase activity level. Ultimate goal will be one hour at least 5 days a week. Discuss an exercise plan tailored to the patient's specific health and mobility and accessibility to resources i.e. gym, exercise equipment, etc.    -Reviewed management of stress. Stress can lead to the release of the "stress hormone" cortisol that can cause weight gain. Excess cortisol can increase the appetite. The rise of cortisol can turn overeating into a habit. Because increased levels of the hormone help cause higher insulin levels, blood sugar drops and it can cause cravings for sweet, fatty and salty foods, behaviors that lead to weight gain and make it harder to lose or maintain weight.   - Reviewed need for long-term use of medication to successfully treat weight.  Have reviewed benefits and side effects of phentermine, phentermine plus topiramate, bupropion plus naltrexone, and GLP-1 receptor agonists including Saxenda, Wegovy, and Zepbound.  Patient would benefit from Zepbound      Orders Placed This Encounter   Procedures    Zoster, SHINGRIX, (50 yrs +), IM    Pneumococcal, PCV20, PREVNAR 20, (age 6w+), IM, PF    CBC  with Auto Differential     Standing Status:   Future     Expected Date:   06/13/2023     Expiration Date:   06/11/2024    Comprehensive Metabolic Panel     Standing Status:  Future     Expected Date:   06/13/2023     Expiration Date:   06/11/2024    Lipid Panel     Standing Status:   Future     Expected Date:   06/13/2023     Expiration Date:   06/11/2024     Is Patient Fasting?/# of Hours:   fasting/ 6-8 hours     Please have 8 hour fasting labs done at a [facility]  Return in about 6 months (around 12/13/2023) for physical, 6 month.

## 2023-06-13 NOTE — Telephone Encounter (Signed)
 Tried to call no answer

## 2023-06-14 NOTE — Telephone Encounter (Signed)
 Pt called no answer lvm for pt to contact insurance company to see what medication they would cover under weight management as her pa for the zepbound was not covered and is under the plans benefit exclusion.

## 2023-08-22 ENCOUNTER — Ambulatory Visit: Admit: 2023-08-22 | Discharge: 2023-08-22 | Payer: Medicaid (Managed Care) | Attending: Neurology

## 2023-08-22 VITALS — BP 124/78 | HR 100 | Ht 66.0 in | Wt 232.0 lb

## 2023-08-22 DIAGNOSIS — G43709 Chronic migraine without aura, not intractable, without status migrainosus: Secondary | ICD-10-CM

## 2023-08-22 MED ORDER — SUMATRIPTAN SUCCINATE 100 MG PO TABS
100 | ORAL_TABLET | ORAL | 2 refills | 30.00000 days | Status: DC | PRN
Start: 2023-08-22 — End: 2024-01-10

## 2023-08-22 NOTE — Progress Notes (Signed)
 Neurology Consult Note            Date:08/22/2023        Patient Name:Shannon Marshall     Date of Birth:[birth year]     Age:53 y.o.    Consults    Chief Complaint     Chief Complaint   Patient presents with    Follow-up     2 month     Migraine             Subjective   Patient seen in follow-up for migraines and paresthesias.  Since her last visit, she did have a bad months where she was having 10-12 migraine days.  She has been taking 100 mg at onset repeat 2 hours but she had ran out of pills.  She tried the [place] which did abort the headache.  She has not had any worsening paresthesias.  She did not get the blood work done she is waiting to get blood work done by her primary care physician as well.  She has not had any other new complaints she denies any other relieving or aggravating factors.    Past Medical History     Past Medical History:   Diagnosis Date    Anxiety     Blood type, Rh negative     Gastroesophageal reflux disease without esophagitis 06/14/2021    Hypertension     Migraine     Seasonal allergies     Sleep apnea           Medications     Prior to Admission medications    Medication Sig Start Date End Date Taking? Authorizing Provider   SUMAtriptan  (IMITREX ) 100 MG tablet Take 1 tablet by mouth as needed for Migraine repeat in 2 hrs for persistent headache. 08/22/23 08/31/23 Yes Bradey Luzier, Ozzie Board, MD   prazosin (MINIPRESS) 1 MG capsule TAKE 1 CAPSULE BY MOUTH AT BEDTIME- MAY LOWER BLOOD PRESSURE 05/14/23  Yes [provider]   buPROPion (WELLBUTRIN SR) 150 MG extended release tablet Take 1 tablet by mouth every morning 05/12/23  Yes [provider]   vitamin D 25 MCG (1000 UT) CAPS Take by mouth   Yes [provider]   estradiol  (ESTRACE ) 0.1 MG/GM vaginal cream Place 1 g vaginally Every Monday, Wednesday, and Friday 06/13/23  Yes Merrill Abide, APRN - NP   tirzepatide -weight management (ZEPBOUND ) 2.5 MG/0.5ML SOAJ subCUTAneous auto-injector pen Inject 2.5 mg into  the skin every 7 days 06/13/23  Yes Merrill Abide, APRN - NP   lisinopril  (PRINIVIL ;ZESTRIL ) 5 MG tablet Take 1 tablet by mouth every morning 06/13/23  Yes Merrill Abide, APRN - NP   Azelastine -Fluticasone  137-50 MCG/ACT SUSP 1 spray by Nasal route in the morning and at bedtime 12/13/22  Yes Merrill Abide, APRN - NP   oxymetazoline  (12 HOUR NASAL SPRAY) 0.05 % nasal spray 1 spray by Nasal route daily as needed for Congestion (no more than 3 days at a time) 12/13/22  Yes Merrill Abide, APRN - NP   Blood Pressure Monitor DEVI 1 each by Does not apply route as needed (hypertension) 12/13/22  Yes Merrill Abide, APRN - NP   Fexofenadine HCl (ALLEGRA ALLERGY PO) Take by mouth   Yes [provider]   fluticasone  (FLONASE) 50 MCG/ACT nasal spray 2 sprays by Nasal route as needed for Allergies   Yes [provider]        No current facility-administered medications for this visit.  Allergies   Penicillins      Review of Systems   No shortness of breath, chest pain, no abdominal pain.  No weight loss or gain.  No fevers or chills.        Physical Exam   BP 124/78 (BP Site: Left Upper Arm)   Pulse 100   Ht 1.676 m (5' 6)   Wt 105.2 kg (232 lb)   BMI 37.45 kg/m    Patient is alert no acute distress.  Vital signs are stable.  Well tended appearance.  Respirations are normal.  Cardiovascular shows no cyanosis, clubbing or edema.    Neurologic Exam:  Neurologic exam remains unchanged from her last exam.  Labs         TSH   Date Value Ref Range Status   12/13/2022 1.800 0.358 - 3.740 mcIU/mL Final     Comment:     TSH INTERPRETATION:    Controversy exists over an acceptable TSH range. Some experts argue that a  narrower reference range is better and will increase the detection of thyroid  disease, particularly marginal hypothyroidism.     11/28/2021 2.830 0.358 - 3.740 mcIU/mL Final     Comment:     TSH INTERPRETATION:    Controversy exists over an  acceptable TSH range. Some experts argue that a  narrower reference range is better and will increase the detection of thyroid  disease, particularly marginal hypothyroidism.     05/31/2021 2.020 0.358 - 3.740 mcIU/mL Final     Comment:     TSH INTERPRETATION:    Controversy exists over an acceptable TSH range. Some experts argue that a  narrower reference range is better and will increase the detection of thyroid  disease, particularly marginal hypothyroidism.        Imaging/Diagnostics   [facility] independently reviewed     No results found.        Assessment /Plan   1. Chronic migraine without aura without status migrainosus, not intractable  Overview:  Patient with recent history of migraines not associated with aura.  Recommend continue sumatriptan  100 mg at onset repeat 2 hours.  If no improvement Ubrelvy 50 mg repeat in 2 hours.  Keep a headache diary.  Message back in 2 weeks.  If having more than 4 migraines per month without improvement with triptan will try a different triptan and consider prophylaxis.  Follow-up 6 months.  Orders:  -     SUMAtriptan  (IMITREX ) 100 MG tablet; Take 1 tablet by mouth as needed for Migraine repeat in 2 hrs for persistent headache., Disp-9 tablet, R-2Normal  2. Paresthesias  Overview:  Patient complaint no worsening bilateral lower extremity paresthesias.  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  Recommend B12, folic acid and ANA pending further recommendations.  Follow-up as above.           Electronically signed by Pasty Bongo, MD on 08/22/23 at 1:39 PM EDT

## 2023-08-22 NOTE — Progress Notes (Signed)
 Pt is seen in office to day as 4 month Follow Up. Per pt she is still having headaches but still feels better 10-12 per month .Per pt her worse was last month this month has gotten  better. Pt would lilke to discuss going up on     SUMAtriptan  (IMITREX )

## 2023-08-24 ENCOUNTER — Ambulatory Visit: Admit: 2023-08-24 | Discharge: 2023-08-24 | Payer: Medicaid (Managed Care) | Attending: Obstetrics & Gynecology

## 2023-08-24 DIAGNOSIS — Z01419 Encounter for gynecological examination (general) (routine) without abnormal findings: Secondary | ICD-10-CM

## 2023-08-24 MED ORDER — COMBIPATCH 0.05-0.14 MG/DAY TD PTTW
0.05-0.14 | MEDICATED_PATCH | TRANSDERMAL | 3 refills | Status: DC
Start: 2023-08-24 — End: 2024-02-21

## 2023-08-24 NOTE — Progress Notes (Signed)
 Shannon Marshall (DOB:  1971-02-21) is a 53 y.o. female,Established patient, here for evaluation of the following chief complaint(s):  Annual Exam      Assessment & Plan   ASSESSMENT/PLAN:  1. Encounter for annual routine gynecological examination  2. Encounter for screening mammogram for malignant neoplasm of breast  -     MAM TOMO DIGITAL SCREEN BILATERAL (PER PROTOCOL); Future  Health maintenance reviewed mammogram today.  Colonoscopy within last 10 years..  Pap smear today  Hot flashes: CombiPatch   Obesity: Weight loss clinic  No follow-ups on file.         Subjective   SUBJECTIVE/OBJECTIVE:  HPI  53 year old presents for annual.  Patient is sexually active with no complaints.  Patient is postmenopausal.  However she complains of hot flashes.  And night sweats.  Nothing makes it better or worse.  It is affecting her quality of life.  Patient also feels like she is overweight would like to lose some weight.  She says she exercises and watches what she eats and is having a hard time losing weight  Allergies   Allergen Reactions    Penicillins Anaphylaxis and Swelling            Current Outpatient Medications   Medication Sig Dispense Refill    SUMAtriptan  (IMITREX ) 100 MG tablet Take 1 tablet by mouth as needed for Migraine repeat in 2 hrs for persistent headache. 9 tablet 2    prazosin (MINIPRESS) 1 MG capsule TAKE 1 CAPSULE BY MOUTH AT BEDTIME- MAY LOWER BLOOD PRESSURE      buPROPion (WELLBUTRIN SR) 150 MG extended release tablet Take 1 tablet by mouth every morning      vitamin D 25 MCG (1000 UT) CAPS Take by mouth      estradiol  (ESTRACE ) 0.1 MG/GM vaginal cream Place 1 g vaginally Every Monday, Wednesday, and Friday 40 g 3    tirzepatide -weight management (ZEPBOUND ) 2.5 MG/0.5ML SOAJ subCUTAneous auto-injector pen Inject 2.5 mg into the skin every 7 days 2 mL 0    lisinopril  (PRINIVIL ;ZESTRIL ) 5 MG tablet Take 1 tablet by mouth every morning 90 tablet 1    Azelastine -Fluticasone  137-50 MCG/ACT SUSP 1 spray  by Nasal route in the morning and at bedtime 1 each 0    oxymetazoline  (12 HOUR NASAL SPRAY) 0.05 % nasal spray 1 spray by Nasal route daily as needed for Congestion (no more than 3 days at a time) 1 each 3    Blood Pressure Monitor DEVI 1 each by Does not apply route as needed (hypertension) 1 each 0    Fexofenadine HCl (ALLEGRA ALLERGY PO) Take by mouth      fluticasone  (FLONASE) 50 MCG/ACT nasal spray 2 sprays by Nasal route as needed for Allergies       No current facility-administered medications for this visit.       Past Medical History:   Diagnosis Date    Anxiety     Blood type, Rh negative     Gastroesophageal reflux disease without esophagitis 06/14/2021    Hypertension     Migraine     Seasonal allergies     Sleep apnea      Past Surgical History:   Procedure Laterality Date    BREAST BIOPSY Right 2015    CESAREAN SECTION N/A     2008 2010 2013    COLONOSCOPY N/A 03/31/2021    COLONOSCOPY DIAGNOSTIC performed by Curtistine CHRISTELLA Pearl, MD at [facility] MAIN OR    DILATION AND CURETTAGE N/A  1997    SINUS SURGERY      WISDOM TOOTH EXTRACTION       Social History     Socioeconomic History    Marital status: Divorced     Spouse name: Not on file    Number of children: Not on file    Years of education: Not on file    Highest education level: Not on file   Occupational History    Not on file   Tobacco Use    Smoking status: Never    Smokeless tobacco: Never   Vaping Use    Vaping status: Never Used   Substance and Sexual Activity    Alcohol use: Never    Drug use: Never    Sexual activity: Defer   Other Topics Concern    Not on file   Social History Narrative    Not on file     Social Drivers of Health     Financial Resource Strain: Not on file   Food Insecurity: Not on file   Transportation Needs: Not on file   Physical Activity: Not on file   Stress: Not on file   Social Connections: Not on file   Intimate Partner Violence: Not on file   Housing Stability: Not on file     Family History   Problem Relation Age of Onset     High Cholesterol Father     High Blood Pressure Father     Coronary Art Dis Father     Heart Attack Father     Alcohol Abuse Father     High Blood Pressure Brother     Breast Cancer Paternal Aunt     High Cholesterol Maternal Grandmother     High Blood Pressure Maternal Grandmother     Diabetes Maternal Grandmother     Stroke Maternal Grandmother     Diabetes Paternal Grandmother     Heart Disease Paternal Grandmother        Review of Systems   Review of Systems - General ROS: negative  Breast ROS: negative for breast lumps  Respiratory ROS: no cough, shortness of breath, or wheezing  Cardiovascular ROS: no chest pain or dyspnea on exertion  Gastrointestinal ROS: no abdominal pain, change in bowel habits, or black or bloody stools  Genito-Urinary ROS: no dysuria, trouble voiding, or hematuria  Musculoskeletal ROS: negative      Objective   Height 1.676 m (5' 5.98).  Blood pressure 124/75, height 1.676 m (5' 5.98), weight 104.9 kg (231 lb 3.2 oz), last menstrual period 07/24/2023.    Physical Exam   Physical Examination: General appearance - alert, well appearing, and in no distress  Chest - clear to auscultation, no wheezes, rales or rhonchi, symmetric air entry  Heart - normal rate, regular rhythm, normal S1, S2, no murmurs, rubs, clicks or gallops  Abdomen - soft, nontender, nondistended, no masses or organomegaly  Breasts - breasts appear normal, no suspicious masses, no skin or nipple changes or axillary nodes  Pelvic - normal external genitalia, vulva, vagina, cervix, uterus and adnexa  Musculoskeletal - no joint tenderness, deformity or swelling        An electronic signature was used to authenticate this note.    --Mitchell JULIANNA Anger, MD

## 2023-08-28 ENCOUNTER — Encounter: Attending: Neurology

## 2023-08-28 LAB — PAP IG, APTIMA HPV AND RFX 16/18,45 (199305)
.: 0
HPV Aptima: NEGATIVE

## 2023-09-04 ENCOUNTER — Encounter

## 2023-09-04 LAB — CBC WITH AUTO DIFFERENTIAL
Basophils %: 0.7 % (ref 0.0–2.0)
Basophils Absolute: 0.1 10*3/uL (ref 0.0–0.2)
Eosinophils %: 1.4 % (ref 0.0–7.0)
Eosinophils Absolute: 0.2 10*3/uL (ref 0.0–0.5)
Hematocrit: 38.1 % (ref 34.0–47.0)
Hemoglobin: 13 g/dL (ref 11.5–15.7)
Immature Grans (Abs): 0.24 10*3/uL — ABNORMAL HIGH (ref 0.00–0.06)
Immature Granulocytes %: 2.3 % — ABNORMAL HIGH (ref 0.0–0.6)
Lymphocytes Absolute: 2.2 10*3/uL (ref 1.0–3.2)
Lymphocytes: 21.4 % (ref 15.0–45.0)
MCH: 30.2 pg (ref 27.0–34.5)
MCHC: 34.1 g/dL (ref 30.0–36.0)
MCV: 88.6 fL (ref 81.0–99.0)
MPV: 9.7 fL (ref 7.0–12.2)
Monocytes %: 5.3 % (ref 4.0–12.0)
Monocytes Absolute: 0.6 10*3/uL (ref 0.3–1.0)
NRBC Absolute: 0 10*3/uL (ref 0.000–0.012)
NRBC Automated: 0 % (ref 0.0–0.2)
Neutrophils %: 68.9 % (ref 42.0–74.0)
Neutrophils Absolute: 7.2 10*3/uL (ref 1.6–7.3)
Platelets: 263 10*3/uL (ref 140–440)
RBC: 4.3 x10e6/mcL (ref 3.60–5.20)
RDW: 13.2 % (ref 10.0–17.0)
WBC: 10.4 10*3/uL (ref 3.8–10.6)

## 2023-09-04 LAB — COMPREHENSIVE METABOLIC PANEL
ALT: 17 U/L (ref 0–42)
AST: 17 U/L (ref 0–46)
Albumin/Globulin Ratio: 1.4 (ref 1.00–2.70)
Albumin: 3.8 g/dL (ref 3.5–5.2)
Alk Phosphatase: 99 U/L (ref 35–117)
Anion Gap: 12 mmol/L (ref 2–17)
BUN: 15 mg/dL (ref 6–20)
CO2: 22 mmol/L (ref 22–29)
Calcium: 8.9 mg/dL (ref 8.5–10.7)
Chloride: 102 mmol/L (ref 98–107)
Creatinine: 0.8 mg/dL (ref 0.5–1.0)
Est, Glom Filt Rate: 89 mL/min/1.73mÂ² (ref >=60)
Globulin: 2.7 g/dL (ref 1.9–4.4)
Glucose: 100 mg/dL — ABNORMAL HIGH (ref 70–99)
Osmolaliy Calculated: 273 mosm/kg (ref 270–287)
Potassium: 4.4 mmol/L (ref 3.5–5.3)
Sodium: 136 mmol/L (ref 135–145)
Total Bilirubin: 0.2 mg/dL (ref 0.00–1.20)
Total Protein: 6.5 g/dL (ref 5.7–8.3)

## 2023-09-04 LAB — VITAMIN B12: Vitamin B-12: 370 pg/mL (ref 232–1245)

## 2023-09-04 LAB — ANA SCREEN WITH REFLEX
ANA SCREEN: NEGATIVE
ENA Sceen Interp: NEGATIVE
dsDNA Interp: NEGATIVE

## 2023-09-04 LAB — LIPID PANEL
Chol/HDL Ratio: 4.7 — ABNORMAL HIGH (ref 0.0–4.4)
Cholesterol, Total: 141 mg/dL (ref 100–200)
HDL: 30 mg/dL — ABNORMAL LOW (ref >=50)
LDL Cholesterol: 56.6 mg/dL (ref 0.0–100.0)
LDL/HDL Ratio: 1.9
Triglycerides: 272 mg/dL — ABNORMAL HIGH (ref 0–149)
VLDL: 54.4 mg/dL — ABNORMAL HIGH (ref 5.0–40.0)

## 2023-09-04 LAB — FOLATE: Folate: 9.54 ng/mL (ref 4.80–24.20)

## 2023-09-05 NOTE — Telephone Encounter (Signed)
 CBC stable, CMP with elevated glucose 100 normal kidney liver function GFR 89, total cholesterol 141, low HDL 30, triglycerides elevated 272, LDL 56.6, ANA normal, B12 and folate within normal  The 10-year ASCVD risk score (Arnett DK, et al., 2019) is: 2.4%    Values used to calculate the score:      Age: 53 years      Sex: Female      Is Non-Hispanic African American: No      Diabetic: No      Tobacco smoker: No      Systolic Blood Pressure: 124 mmHg      Is BP treated: Yes      HDL Cholesterol: 30 mg/dL      Total Cholesterol: 141 mg/dL

## 2023-09-05 NOTE — Other (Signed)
 Left Voice message to go over Lab results with pt.

## 2023-10-01 ENCOUNTER — Encounter: Payer: Medicaid (Managed Care) | Attending: Obstetrics & Gynecology

## 2023-10-01 NOTE — Telephone Encounter (Signed)
 Patient had a telephone visit scheduled with Dr. Wetzel today at 2 pm. Patient is requesting a return call to reschedule.

## 2023-10-01 NOTE — Telephone Encounter (Signed)
 Spoke with pt she reschedule to 7/31 she want Dr Wetzel to know the patch is working great > message sent to doc via CMA

## 2023-10-04 ENCOUNTER — Encounter: Payer: Medicaid (Managed Care) | Attending: Obstetrics & Gynecology

## 2023-10-09 ENCOUNTER — Ambulatory Visit: Payer: Medicaid (Managed Care)

## 2023-10-31 ENCOUNTER — Encounter: Attending: Surgery

## 2023-10-31 NOTE — Progress Notes (Unsigned)
 [facility]  BARIATRIC SURGERY - [address]  [address]  [id number]  [place] [address]  Dept: [phone]    Bariatric Surgery and Medical Weight Loss Division  PROGRESS NOTE - New Patient Evaluation     Patient: Shannon Marshall        Service Date: 10/31/2023      HPI:     No chief complaint on file.      Patient presents today to our office to evaluate treatment options within our comprehensive obesity treatment program to address their obesity and comorbid conditions.  At the time the patient presented to our office today, they were @height @ tall, weighed @weight @ pounds, and had a BMI of There is no height or weight on file to calculate BMI.. The patient's ideal body weight was determined to be [ ]  pounds, and their excess body weight was calculated at [ ]  pounds.  In reviewing the patient's history today, the patient reports that their highest weight was noted to be [ ]  pounds.  The patient reports that the most amount of weight that they is lost at any given time, was [ ]  pounds.  The patient's weight loss goal prior to joining the program is [ ]  pounds.    Patient's current exercise assessment is as follows:    Physical activity:        []  None []  Structured []  Unstructured  Frequency per week: []  1    []  2   []  3   []  4    []  5   []  6 []  7   Type:                          []  Cardio []  Strength []  Group Classes []  Online Videos   Length in minutes:      []  10 []  20 []  30 []  40 []  50 []  60 []  60+    Patient's current nutritional assessment is as follows:    Do you have a calorie goal?:           []  Yes                    []  No                     []  Don't know.    Do you have a protein goal?:           []  Yes                    []  No                     []  Don't know.    Fluid Intake:     Water     []  < 64 oz  []  64 oz  []  > 64 oz        Sugar-sweetened  []  Daily  []  Weekly  []  Monthly []  Rare  Artificially sweetened  []  Daily  []  Weekly  []  Monthly []   Rare  Carbonated   []  Daily  []  Weekly  []  Monthly []  Rare  Caffeinated   []  Daily  []  Weekly  []  Monthly []  Rare    Alcohol    []  Daily  []  Weekly  []  Monthly []  Rare      Patient's current sleep assessment is as follows:  Hours of sleep per night:                 []  4-5 hours/night  []  6-7 hours/night  []  8 hours or more/night  Do you snore?:                                []  Yes                    []  No                     []  Don't know.    Do you feel rested in the morning?  []  Yes                    []  No                     []  Don't know.    Do you have sleep apnea?:             []  Yes                    []  No                     []  Don't know.  Do you use CPAP/BiPAP?:              []  Yes                    []  No                     []  N/A        The patient and I had a long discussion today regarding the importance of diet, exercise, lifestyle changes, the use of oral and injectable obesity management medications, and bariatric surgery with regards to the treatment of obesity and associated comorbidities.  Risks, benefits, complications, typical outcomes, limitations, and the importance of lifelong compliance was also discussed.  After this discussion, the patient has decided to pursue obesity medicine as their initial treatment option to address their obesity and associated comorbidities.  The patient's body composition measurements are summarized below:      Medical History:  Past Medical History:   Diagnosis Date    Anxiety     Blood type, Rh negative     Gastroesophageal reflux disease without esophagitis 06/14/2021    Hypertension     Migraine     Seasonal allergies     Sleep apnea        Surgical History:  Past Surgical History:   Procedure Laterality Date    BREAST BIOPSY Right 2015    CESAREAN SECTION N/A     2008 2010 2013    COLONOSCOPY N/A 03/31/2021    COLONOSCOPY DIAGNOSTIC performed by Curtistine CHRISTELLA Pearl, MD at [facility]    DILATION AND CURETTAGE N/A 1997    SINUS SURGERY      WISDOM TOOTH  EXTRACTION         Family History:      Problem Relation Age of Onset    High Cholesterol Father     High Blood Pressure Father     Coronary Art Dis Father     Heart Attack Father     Alcohol Abuse Father     High Blood Pressure Brother  Breast Cancer Paternal Aunt     High Cholesterol Maternal Grandmother     High Blood Pressure Maternal Grandmother     Diabetes Maternal Grandmother     Stroke Maternal Grandmother     Diabetes Paternal Grandmother     Heart Disease Paternal Grandmother        Social History:   Social History     Tobacco Use    Smoking status: Never    Smokeless tobacco: Never   Vaping Use    Vaping status: Never Used   Substance Use Topics    Alcohol use: Never    Drug use: Never       Current Med List:  Current Outpatient Medications   Medication Sig Dispense Refill    estradiol -norethindrone  (COMBIPATCH ) 0.05-0.14 MG/DAY Place 1 patch onto the skin Twice a Week 24 patch 3    SUMAtriptan  (IMITREX ) 100 MG tablet Take 1 tablet by mouth as needed for Migraine repeat in 2 hrs for persistent headache. 9 tablet 2    prazosin (MINIPRESS) 1 MG capsule TAKE 1 CAPSULE BY MOUTH AT BEDTIME- MAY LOWER BLOOD PRESSURE      buPROPion (WELLBUTRIN SR) 150 MG extended release tablet Take 1 tablet by mouth every morning      vitamin D 25 MCG (1000 UT) CAPS Take by mouth      estradiol  (ESTRACE ) 0.1 MG/GM vaginal cream Place 1 g vaginally Every Monday, Wednesday, and Friday 40 g 3    lisinopril  (PRINIVIL ;ZESTRIL ) 5 MG tablet Take 1 tablet by mouth every morning 90 tablet 1    Blood Pressure Monitor DEVI 1 each by Does not apply route as needed (hypertension) 1 each 0    fluticasone  (FLONASE) 50 MCG/ACT nasal spray 2 sprays by Nasal route as needed for Allergies       No current facility-administered medications for this visit.          SOCIAL:      Comprehension    Ability to grasp concepts and respond to questions:   []  High   []  Medium   []  Low    Motivation    []  Asks Questions; eager to learn   []  Needs  education   []  Extreme anxiety    []  uncooperative   []  Denies need for education    English Speaking Ability    []  Speaks English well   []  Reads English well   []  Understands spoken english    []  Understands written English   []  No need for interpretive support      []  Might benefit from interpretive support   []  Interpreter required for all services     REVIEW OF SYSTEMS: (Negative unless noted in HPI)         PRESENT ILLNESS:     Weight Parameters  Weight     Height     BMI There is no height or weight on file to calculate BMI.   IBW     EBW               IMMUNIZATION STATUS  Immunization History   Administered Date(s) Administered    COVID-19, MODERNA BLUE border, Primary or Immunocompromised, (age 12y+), IM, 100 mcg/0.5mL 10/16/2019    COVID-19, MODERNA Bivalent, (age 12y+), IM, 50 mcg/0.5 mL 05/31/2021    Hep A, HAVRIX, VAQTA, (age 19y+), IM, 1mL 08/19/2015, 08/19/2015    Influenza Virus Vaccine 11/28/2019, 01/11/2021    Influenza, FLUARIX, FLULAVAL, FLUZONE (age 6 mo+) and AFLURIA, (age  3 y+), Quadv PF, 0.28mL 11/28/2019, 03/01/2021, 04/05/2022    Influenza, FLUCELVAX, (age 102 mo+) IM, Trivalent PF, 0.5mL 12/13/2022    Influenza, FLUCELVAX, (age 102 mo+), MDCK, Quadv PF, 0.48mL 02/03/2017    Pneumococcal, PCV20, PREVNAR 20, (age 102w+), IM, 0.69mL 06/13/2023    TDaP, ADACEL (age 1021y-64y), MYRTICE (age 10y+), IM, 0.5mL 12/08/2019    Typhoid Vaccine 08/19/2015    Typhoid Vi capsular polysaccharide (Typhim VI) 08/19/2015    Zoster Recombinant (Shingrix) 06/13/2023       FALLS ASSESSMENT    []  LOW RISK FOR FALLS    []  MODERATE RISK FOR FALLS    []  Difficulty walking/selfcare    []  Falls in the past 2 months    []  Suspicion of Clinician    []  Other:      SMOKING CESSATION     []  Not needed     []  Instructed to stop smoking    []  Pamphlet community resources given     VTE SCREEN    []  Family hx DVT/PE  /   []  Personal hx of DVT/PE    []  Denies any family or personal hx of DVT/PE    Physician Review    []  Past medical,  family, & social history reviewed.     Review of surgery and post-surgical changes (by surgeon for surgical patients only)    []  Lifelong diet expectations reviewed with patient    []  Need for lifelong vitamin supplementation reviewed with patient    PHYSICAL EXAMINATION:      There were no vitals taken for this visit.    No physical examination was performed today as the patient's visit was primarily a consultation.  We reviewed the patient's past medical history, discussed various treatment options, and created a care plan going forward.    A chaperone was present during my examination.    General Appearance:   Patient in no acute distress, is morbidly obese, and presents today to our office excited about the possibility of bariatric surgery in the near future to address the patient's morbid obesity and associated comorbidities  HEENT:  HEAD:  Normocephalic, atraumatic, facial symmetry present.   EARS:  Hearing is normal, can hear a low whisper.   NOSE:  Nares and oropharynx are clear.   ORAL CAVITY:  Dentition is good.   THROAT:  Mucous membranes are moist.   LYMPH NODES:  No evidence of cervical lymphadenopathy.   Neurological:  SENSORY:  Function and sensation intact without focal deficits.   Ophthalmology:  VISUAL ACUITY:  Pupils equally round and reactive to light, Sclera non-icteric.   EXTRAOCULAR MOVEMENT:  intact.   CONJUNCTIVA:  conjunctiva normal.   Neck:  NORMAL NECK:  Soft and supple, trachea is midline.   THYROID:  Thyroid is normal to exam.   Pulmonology:  LUNGS:  Breath sounds are clear to auscultation bilaterally, symmetrical movement on inspiration without use of the patient's accessory muscles.   Larynx/Hypopharynx:  NASOPHARYNX:  normal without lesions .   Cardiology Exam:  Carotid Upstroke:  no bruit.   Jugular Venous Distention:  none.   Heart:  regular rate and rhythm.   Heart murmur:  none.   Peripheral Pulses:  No significant/Mild/Moderate/Severe  bilateral lower extremity edema is  appreciated.   Gastrointestinal:  ABDOMEN:  Abdomen is soft to palpation, without peritoneal signs, there is no evidence of hernia, or enlarged liver or spleen. The patient's obesity is distributed in an android distribution. The patient's abdomen is protuberant, yet soft/firm,  raising the suspicion for nonalcoholic steatohepatitis and/or a preponderance of visceral fat. The patient has no significant transverse abdominal crease. The patient has no previous incisions. 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   Musculoskeletal:  Gait:  Ambulates with a normal gait without assistance from a cane or walker.   Station:  Normal strength and range of motion of all extremities.   Dermatology:  SKIN:  Warm and dry, without evidence of lesions or rashes.   Psychiatry:  THOUGHT CONTENT:  Alert and oriented to person, place and time, patient has a bright affect, euthymic mood, and appropriate insight, short and long term memory intact.     {Preoperative Clearance:47280::Behavioral Health evaluation,Thyroid panel,Letter of necessity from PCP,Weight history,Patient education class,Nutritional counseling with bariatric dietitian}      RECOMMENDATIONS:     {There are no diagnoses linked to this encounter. (Refresh or delete this SmartLink)}    After reviewing the past medical history, history of present illness, and undergoing an evaluation, I feel the patient would possibly benefit from a bariatric surgical procedure, and is an appropriate candidate for further evaluation in preparation for bariatric surgery.  We have discussed the adjustable gastric band, the gastric sleeve resection, and the Roux-en-Y divided gastric bypass.  The patient's clearance process was outlined above. The patient understands and agrees to proceed with this plan. For the convenience of the patient, we will begin the preliminary planning for surgery as we continue to evaluate the possibility. In addition to clearance for surgery and  general anesthesia, the patient will require an esophagogastroduodenoscopy to make a final determination regarding the appropriate procedure to perform regarding the patient's particular anatomy. A definite decision for surgery will be made following the esophagogastroduodenoscopy and plans will be altered or moved forward accordingly. The patient understands and agrees to proceed with this plan. Once the evaluations are complete, we will then see the patient back for further discussion and consultation regarding the possible surgical options.  Prior to the patient's departure today, we did discuss diet and exercise, foods that the patient needs to avoid, and a plan to increase their activity as we proceed for possible bariatric surgery.  We also discussed the importance of diet and exercise prior to surgery, and gave the patient information regarding our bariatric diet and exercise program. We spent a significant amount of time today answering questions, performing the physical examination, discussing a clearance process, and counseling the patient regarding these efforts.    After reviewing the past medical history, history of present illness, and undergoing an evaluation, I feel the patient would benefit from joining our multidisciplinary obesity treatment program, and begin treatment with oral/injectable obesity management medications.      The patient will be started on phentermine at a dose of 10/19/35.5 mg daily.  Patient educational materials regarding phentermine were provided, and patient's signature was obtained to confirm receipt.      The patient will be started on topiramate beginning at 25 mg daily.  Patient educational materials regarding topiramate were provided, and patient's signature was obtained to confirm receipt.      The patient will be started on metformin beginning at 500 mg daily.  Patient educational materials regarding metformin were provided, and patient's signature was obtained to  confirm receipt.      The patient will be started on naltrexone beginning [ ]  mg daily.  Patient educational materials regarding naltrexone were provided, and patient's signature was obtained to confirm receipt.  The patient will be started on bupropion beginning at [ ]  mg daily.  Patient educational materials regarding bupropion were provided, and patient's signature was obtained to confirm receipt.      The patient will be started on naltrexone/bupropion beginning at [ ]  mg daily.  Patient educational materials regarding naltrexone/bupropion were provided, and the patient's signature was obtained to confirm receipt.      The patient will be started on phentermine/topiramate beginning at [ ]  mg daily.  Patient educational materials regarding phentermine/topiramate were provided, and the patient's signature was obtained to confirm receipt.     The patient will be started on semaglutide (Ozempic/Wegovy) beginning at 0.25 mg weekly via subcutaneous injection.  Patient educational materials regarding semaglutide were provided, and the patient's signature was obtained to confirm receipt.  The patient will contact our office after the 3rd dose to discuss a possible refill/dose change prior to prescribing a second month of therapy.      The patient will be started on tirzepatide  (Mounjaro /Zepbound ) beginning at 2.5 mg weekly via subcutaneous injection, and titrate till effective and per recommendations.  Patient educational materials regarding tirzepatide  were provided, and the patient's signature was obtained to confirm receipt.  The patient will contact our office after the third dose to discuss a possible refill dose change prior to prescribing a second month of therapy. Of note, tirzepatide  is an injectable prescription indicated as an adjunct to diet and exercise to improve glycemic control in adults with type 2 diabetes mellitus. GLP-1 RAs may also exert beneficial effects on multiple organ systems in which  GLP-1 receptors are present, including the cardiovascular and renal systems.  GLP-1 RAs also inhibit gastric emptying, thereby slowing gastrointestinal motility and leading to feelings of satiety and diminished appetite thus notable weight loss.  GLP-1 RAs have been reported to reduce the incidence of CV events and improve CV risk factors such as blood pressure and serum lipid concentrations. The most common adverse effects in GLP-1 RAs studies are generally transient, mild-to-moderate gastrointestinal events, particularly nausea, vomiting and diarrhea.  Patient denies any history of pancreatitis or medullary thyroid cancer. Patients must ensure adequate hydration while taking these medications.      GLP-1's like Wegovy (semaglutide), Zepbound  (tirzepatide ) and Saxenda (liraglutide) are FDA approved for weight loss, and are not clinically appropriate for everyone. The GLP-1's Ozempic (semaglutide) and GLP-1/GIP Mounjaro  (tirzepatide ) are FDA approved for diabetes. From time to time GLP-1's may be difficult to access due to high costs, limited insurance coverage, and occasional shortages. Up to 30% of patients treated with these medications do not lose a significant amount of weight, and are classified non-responders. The GLP-1's are prescribed as life-long medications, otherwise, the patient risks weight regain and the loss of any cardioprotective benefits.     Other weight-loss medications may be better suited to a patient's biology and lifestyle. Those effective FDA-approved prescription medications that may aid in weight loss, directly or indirectly, include metformin, Contrave (bupropion with naltrexone), phentermine, topiramate, zonisamide, Qsymia (phentermine with topiramate), and Vyvanse (lisdexamfetamine dimesylate). All medications are prescribed or discontinued at the weight loss provider's discretion and at any time on a patient's weight loss journey.    We will order baseline labwork for assessment of  possible contributing factors leading to the patient's current state of obesity.  These fasting labs will include CBC, CMP, lipid panel, hemoglobin A1c, TSH, insulin level, and 25-hydroxy vitamin D levels.  The results will be interpreted and these results may affect medical recommendations going  forward.      The patient will be referred to pulmonary medicine for evaluation of symptoms suggestive of obstructive sleep apnea.  Obstructive sleep apnea (OSA) left untreated can negatively impact weight and food-related behaviors. OSA can also cause cardiovascular disease, type 2 diabetes, reduced quality of life, and increased risk of mortality. Weight loss can improve symptoms of OSA.  If the diagnosis of obstructive sleep apnea is confirmed, the patient needs to wear the CPAP mask at least six hours a night consistently to reduce negative impact on metabolism and weight loss.    The patient was encouraged to keep a food diary in preparation for meeting with our bariatric dietitians for dietary instruction, and metabolic testing.  The patient will also be scheduled for an appointment with our bariatric counselor to assist with long-term compliance and coping mechanisms.  The patient will follow-up with an obesity medicine provider in 3 months.  The patient knows to call for any problems or questions.  Patient understands and agrees to proceed with this plan.      Greater than 30/40/45/60 minutes were spent today reviewing the patient's chart, the patient's past medical history, discussing the pathophysiology and treatment of the chronic disease of obesity, reviewing and interpreting the patient's body composition measurements, answering the patient's questions to their satisfaction, and creating a care plan going forward.     PLAN:     The patient will r follow-up with our bariatric dietitian, bariatric counselor, and obesity medicine provider as outlined above.      PLAN:     The patient will return to our office once  preoperative clearance process is complete

## 2023-11-07 ENCOUNTER — Ambulatory Visit: Payer: Medicaid (Managed Care)

## 2023-12-11 ENCOUNTER — Encounter: Payer: Medicaid (Managed Care) | Attending: Student in an Organized Health Care Education/Training Program

## 2023-12-13 ENCOUNTER — Encounter: Attending: Student in an Organized Health Care Education/Training Program

## 2024-01-10 ENCOUNTER — Ambulatory Visit
Admit: 2024-01-10 | Discharge: 2024-01-10 | Payer: Medicaid (Managed Care) | Attending: Student in an Organized Health Care Education/Training Program

## 2024-01-10 DIAGNOSIS — Z114 Encounter for screening for human immunodeficiency virus [HIV]: Principal | ICD-10-CM

## 2024-01-10 DIAGNOSIS — Z0000 Encounter for general adult medical examination without abnormal findings: Secondary | ICD-10-CM

## 2024-01-10 MED ORDER — SUMATRIPTAN SUCCINATE 50 MG PO TABS
50 | ORAL_TABLET | Freq: Once | ORAL | 3 refills | Status: AC | PRN
Start: 2024-01-10 — End: ?

## 2024-01-10 MED ORDER — PROGESTERONE 100 MG PO CAPS
100 | ORAL_CAPSULE | ORAL | 0 refills | 90.00000 days | Status: DC
Start: 2024-01-10 — End: 2024-02-21

## 2024-01-10 MED ORDER — LISINOPRIL 5 MG PO TABS
5 | ORAL_TABLET | Freq: Every morning | ORAL | 1 refills | 90.00000 days | Status: DC
Start: 2024-01-10 — End: 2024-02-07

## 2024-01-10 MED ORDER — ESTRADIOL 0.1 MG/24HR TD PTWK
0.1 | MEDICATED_PATCH | TRANSDERMAL | 0 refills | 84.00000 days | Status: DC
Start: 2024-01-10 — End: 2024-02-07

## 2024-01-10 NOTE — Progress Notes (Signed)
 "Date of Service: 01/10/2024  Patient: Shannon Marshall  DOB: [birth year]  Clotilda Reeve a 53 y.o. female presents today for Comprehensive Annual Physical Exam     Patient Care Team:  Roseann Laymon BRAVO, DO as PCP - General (Internal Medicine)  Roseann Laymon BRAVO, DO as PCP - Empaneled Provider  Schwartzberg, Pearly RAMAN, MD as Consulting Physician (Obstetrics & Gynecology)  Wetzel Mitchell FALCON, MD as Consulting Physician (Obstetrics & Gynecology)  Jodine Rodgers RAMAN, MD as Consulting Physician (Neurology)    Chief Complaint   Patient presents with    Annual Exam    Other     Would like to have something recommended for hormones other than what she is taking COMBIPATCH     Medication Adjustment     Would like the 50 mg of Imitrex  instead of the 100    Immunizations     Flu and 2nd shingles         Current Health Issues:  HRT:  Patient on CombiPatch .  Is having difficulty with availability through her pharmacy. Is having break through symptoms: fatigue, cold sweats, low energy, low libido, insomnia with sleep interruptions with difficulty falling back asleep.   Requesting change.  Does have a GYN.  LMP: August. Is irregular.     HTN:  Pt has a history of hypertension  Pt is  compliant with taking medication.  Pt's antihypertensive medication listed in med list.   Pt is advised to have eye exams yearly and screening for microalbuminuria at least once a year.  Pt denies CP, SOB, DOE, vision changes, and peripheral edema.  Home monitoring of blood pressure results: 130/68 highest, usually in 120's/70's  Prescribed lisinopril  5 mg. No side effects    Headaches:   Headache days per month- two   Did better on Imitrex  50 mg. 100 mg causes side effects of sensory issues. Feels out of touch with body.     BMI 37.33:  Pt is joining weight watchers menopause. Pt encouraged to incorporate daily moderate intensity exercise.   Lab Results   Component Value Date    NA 136 09/04/2023    K 4.4 09/04/2023    CL 102 09/04/2023    CO2 22  09/04/2023       Lab Results   Component Value Date    CREATININE 0.8 09/04/2023    BUN 15 09/04/2023    NA 136 09/04/2023    K 4.4 09/04/2023    CL 102 09/04/2023    CO2 22 09/04/2023          Preventive Health Screening:  Health Maintenance   Topic Date Due    HIV screen  Never done    Hepatitis C screen  Never done    Hepatitis B vaccine (1 of 3 - 19+ 3-dose series) Never done    COVID-19 Vaccine (3 - 2025-26 season) 11/05/2023    Depression Screen  06/12/2024    Breast cancer screen  08/22/2024    Cervical cancer screen  08/23/2028    Lipids  09/03/2028    DTaP/Tdap/Td vaccine (2 - Td or Tdap) 12/07/2029    Colorectal Cancer Screen  04/01/2031    Flu vaccine  Completed    Shingles vaccine  Completed    Pneumococcal 50+ years Vaccine  Completed    Hepatitis A vaccine  Aged Out    Hib vaccine  Aged Out    Polio vaccine  Aged Out    Meningococcal (ACWY) vaccine  Aged Out  Meningococcal B vaccine  Aged Out    Depression Monitoring  Discontinued    Pneumococcal 0-49 years Vaccine  Discontinued    Diabetes screen  Discontinued       Immunization History   Administered Date(s) Administered    COVID-19, Inactive, MODERNA BLUE border, Primary or Immunocompromised, (age 12y+) 10/16/2019    COVID-19, Inactive, MODERNA Bivalent, (age 12y+) 05/31/2021    Hep A, HAVRIX, VAQTA, (age 19y+), IM, 1mL 08/19/2015, 08/19/2015    Influenza Virus Vaccine 01/20/2010, 11/28/2019, 01/11/2021    Influenza, FLUARIX, FLULAVAL, FLUZONE (age 532 mo+) and AFLURIA, (age 58 y+), Quadv PF, 0.24mL 11/28/2019, 03/01/2021, 04/05/2022    Influenza, FLUCELVAX, (age 53 mo+) IM, Trivalent PF, 0.5mL 12/13/2022, 01/10/2024    Influenza, FLUCELVAX, (age 53 mo+), MDCK, Quadv PF, 0.79mL 02/03/2017    Pneumococcal, PCV20, PREVNAR 20, (age 53w+), IM, 0.61mL 06/13/2023    TDaP, ADACEL (age 538y-64y), MYRTICE (age 10y+), IM, 0.24mL 12/08/2019    Typhoid Vaccine 08/19/2015    Typhoid Vi capsular polysaccharide (Typhim VI) 08/19/2015    Zoster Recombinant (Shingrix)  06/13/2023, 01/10/2024         Current Outpatient Medications   Medication Sig Dispense Refill    busPIRone  (BUSPAR ) 5 MG tablet Take 1 tablet by mouth 2 times daily      estradiol  (CLIMARA ) 0.1 MG/24HR Place 1 patch onto the skin every 7 days 4 patch 0    progesterone  (PROMETRIUM ) 100 MG CAPS capsule Take 100 to 200 mg by mouth at night 60 capsule 0    SUMAtriptan  (IMITREX ) 50 MG tablet Take 1 tablet by mouth once as needed for Migraine 8 tablet 3    lisinopril  (PRINIVIL ;ZESTRIL ) 5 MG tablet Take 1 tablet by mouth every morning 90 tablet 1    estradiol -norethindrone  (COMBIPATCH ) 0.05-0.14 MG/DAY Place 1 patch onto the skin Twice a Week 24 patch 3    prazosin  (MINIPRESS ) 1 MG capsule TAKE 1 CAPSULE BY MOUTH AT BEDTIME- MAY LOWER BLOOD PRESSURE      vitamin D 25 MCG (1000 UT) CAPS Take by mouth      estradiol  (ESTRACE ) 0.1 MG/GM vaginal cream Place 1 g vaginally Every Monday, Wednesday, and Friday 40 g 3    Blood Pressure Monitor DEVI 1 each by Does not apply route as needed (hypertension) 1 each 0    fluticasone  (FLONASE) 50 MCG/ACT nasal spray 2 sprays by Nasal route as needed for Allergies       No current facility-administered medications for this visit.       Allergies   Allergen Reactions    Penicillins Anaphylaxis and Swelling            Patient Active Problem List   Diagnosis    Perimenopause    Metrorrhagia    Mixed stress and urge urinary incontinence    Urinary hesitancy    Irregular menses    Obesity, unspecified    Precordial pain    Essential hypertension    Acute bacterial sinusitis    Gastroesophageal reflux disease without esophagitis    Abnormal ECG    Hypertensive heart disease without heart failure    Chronic migraine without aura without status migrainosus, not intractable    Paresthesias     Past Medical History:   Diagnosis Date    Anxiety     Blood type, Rh negative     Gastroesophageal reflux disease without esophagitis 06/14/2021    Hypertension     Migraine     Seasonal allergies  Sleep  apnea      Past Surgical History:   Procedure Laterality Date    BREAST BIOPSY Right 2015    CESAREAN SECTION N/A     2008 2010 2013    COLONOSCOPY N/A 03/31/2021    COLONOSCOPY DIAGNOSTIC performed by Curtistine CHRISTELLA Pearl, MD at [facility]    DILATION AND CURETTAGE N/A 1997    SINUS SURGERY      WISDOM TOOTH EXTRACTION       Family History   Problem Relation Age of Onset    High Cholesterol Father     High Blood Pressure Father     Coronary Art Dis Father     Heart Attack Father     Alcohol Abuse Father     High Blood Pressure Brother     Breast Cancer Paternal Aunt     High Cholesterol Maternal Grandmother     High Blood Pressure Maternal Grandmother     Diabetes Maternal Grandmother     Stroke Maternal Grandmother     Diabetes Paternal Grandmother     Heart Disease Paternal Grandmother      Social History     Tobacco Use    Smoking status: Never    Smokeless tobacco: Never   Substance Use Topics    Alcohol use: Never     Social History     Social History Narrative    Not on file         ROS:    Review of Systems    See HPI, All other ROS are negative       There were no vitals taken for this visit.    General: conversive, pleasant, awake, alert   HEENT: TM pearly grey and intact without excessive cerumen bilaterally, PERRLA, red light reflex intact bilaterally, conjunctiva normal, Neck supple, no lymphadenopathy, mucus membranes moist, no thyromegaly  Respiratory: breath sounds clear throughout, non-labored breathing  Cardiovascular: RRR, no MGR, no carotid bruits, radial and pedal pulses, no edema  GI: obese, non-tender, soft, no liver edge, no masses  GU: no suprapubic tenderness  Neuro: A/O x4, speech normal, CN II-XII intact, no focal motor or sensory deficits  MSK: Full ROM in BUE and BLE  Skin: no rashes, warm, dry, new lesion on right mid back, pale freckled skin  Psychiatric: cooperative  Reproductive: deferred       ASSESSMENT and PLAN:  1. Annual physical exam      2. Encounter for screening for HIV    -  HIV Screen; Future    3. Need for hepatitis C screening test    - Hepatitis C Antibody; Future    4. Screening for thyroid disorder    - TSH reflex to FT4; Future    5. Screening for diabetes mellitus    - Hemoglobin A1C; Future    6. Screening for cholesterol level    - Lipid Panel; Future    7. Encounter for vitamin deficiency screening    - Vitamin D 25 Hydroxy; Future    8. Essential hypertension    - CBC with Auto Differential; Future  - Comprehensive Metabolic Panel; Future  - Albumin/Creatinine Ratio, Urine  - lisinopril  (PRINIVIL ;ZESTRIL ) 5 MG tablet; Take 1 tablet by mouth every morning  Dispense: 90 tablet; Refill: 1    9. Mixed hyperlipidemia    - CBC with Auto Differential; Future  - Lipid Panel; Future    10. Need for influenza vaccination    - Influenza, FLUCELVAX Trivalent, (  age 14 mo+) IM, Preservative Free, 0.5mL    11. Need for shingles vaccine    - Zoster, SHINGRIX, (50 yrs +), IM    12. Perimenopause  Stop combi patch and start below meds after labs have been completed  - estradiol  (CLIMARA ) 0.1 MG/24HR; Place 1 patch onto the skin every 7 days  Dispense: 4 patch; Refill: 0  - progesterone  (PROMETRIUM ) 100 MG CAPS capsule; Take 100 to 200 mg by mouth at night  Dispense: 60 capsule; Refill: 0  - Estradiol ; Future  - Estrogens, Total; Future  - Progesterone ; Future  - Follicle Stimulating Hormone; Future  - Testosterone, Free/Tot Equilib; Future    13. Low energy    - Testosterone, Free/Tot Equilib; Future    14. Migraine without status migrainosus, not intractable, unspecified migraine type    Decreased from 100 mg per patient request and due to side effects from 100 mg- SUMAtriptan  (IMITREX ) 50 MG tablet; Take 1 tablet by mouth once as needed for Migraine  Dispense: 8 tablet; Refill: 3    15. Skin cancer screening    - Amb External Referral To Dermatology     Orders Placed This Encounter   Procedures    Influenza, FLUCELVAX Trivalent, (age 37 mo+) IM, Preservative Free, 0.5mL    Zoster, SHINGRIX,  (50 yrs +), IM    HIV Screen     Standing Status:   Future     Expected Date:   01/10/2024     Expiration Date:   01/08/2025    Hepatitis C Antibody     Standing Status:   Future     Expected Date:   01/10/2024     Expiration Date:   01/08/2025    CBC with Auto Differential     Standing Status:   Future     Expected Date:   01/10/2024     Expiration Date:   01/08/2025    Comprehensive Metabolic Panel     Standing Status:   Future     Expected Date:   01/10/2024     Expiration Date:   01/08/2025    TSH reflex to FT4     Standing Status:   Future     Expected Date:   01/10/2024     Expiration Date:   01/08/2025    Hemoglobin A1C     Standing Status:   Future     Expected Date:   01/10/2024     Expiration Date:   01/08/2025    Lipid Panel     Standing Status:   Future     Expected Date:   01/10/2024     Expiration Date:   01/08/2025     Is Patient Fasting?/# of Hours:   fasting/ 6-8 hours    Vitamin D 25 Hydroxy     Standing Status:   Future     Expected Date:   01/10/2024     Expiration Date:   01/08/2025    Albumin/Creatinine Ratio, Urine    Estradiol      Standing Status:   Future     Expected Date:   01/10/2024     Expiration Date:   01/09/2025    Estrogens, Total     Standing Status:   Future     Expected Date:   01/10/2024     Expiration Date:   01/09/2025    Progesterone      Standing Status:   Future     Expected Date:   01/10/2024  Expiration Date:   01/09/2025    Follicle Stimulating Hormone     Standing Status:   Future     Expected Date:   01/10/2024     Expiration Date:   01/09/2025    Testosterone, Free/Tot Equilib     Standing Status:   Future     Expected Date:   01/10/2024     Expiration Date:   01/09/2025    Amb External Referral To Dermatology     Referral Priority:   Routine     Referral Reason:   Specialty Services Required     Referred to Provider:   Floretta Sharolyn Dover, MD     Requested Specialty:   Dermatology     Number of Visits Requested:   1     Return in about 4 weeks (around 02/07/2024) for lab review, HRT med  change.   "

## 2024-01-12 ENCOUNTER — Encounter

## 2024-01-12 LAB — COMPREHENSIVE METABOLIC PANEL
ALT: 18 U/L (ref 0–42)
AST: 14 U/L (ref 0–46)
Albumin/Globulin Ratio: 1.68 (ref 1.00–2.70)
Albumin: 4.2 g/dL (ref 3.5–5.2)
Alk Phosphatase: 87 U/L (ref 35–117)
Anion Gap: 10 mmol/L (ref 2–17)
BUN: 15 mg/dL (ref 6–20)
CALCIUM,CORRECTED,CCA: 9.2 mg/dL (ref 8.5–10.7)
CO2: 27 mmol/L (ref 22–29)
Calcium: 9.3 mg/dL (ref 8.5–10.7)
Chloride: 104 mmol/L (ref 98–107)
Creatinine: 0.7 mg/dL (ref 0.5–1.0)
Est, Glom Filt Rate: 104 mL/min/1.73mÂ² (ref >=60)
Globulin: 2.5 g/dL (ref 1.9–4.4)
Glucose: 84 mg/dL (ref 70–99)
Osmolaliy Calculated: 280 mosm/kg (ref 270–287)
Potassium: 4.5 mmol/L (ref 3.5–5.3)
Sodium: 141 mmol/L (ref 135–145)
Total Bilirubin: 0.37 mg/dL (ref 0.00–1.20)
Total Protein: 6.7 g/dL (ref 5.7–8.3)

## 2024-01-12 LAB — CBC WITH AUTO DIFFERENTIAL
Basophils %: 0.5 % (ref 0.0–2.0)
Basophils Absolute: 0 x10e3/mcL (ref 0.0–0.2)
Eosinophils %: 1.7 % (ref 0.0–7.0)
Eosinophils Absolute: 0.2 x10e3/mcL (ref 0.0–0.5)
Hematocrit: 37.1 % (ref 34.0–47.0)
Hemoglobin: 12.6 g/dL (ref 11.5–15.7)
Immature Grans (Abs): 0.08 x10e3/mcL — ABNORMAL HIGH (ref 0.00–0.06)
Immature Granulocytes %: 0.9 % — ABNORMAL HIGH (ref 0.0–0.6)
Lymphocytes Absolute: 1.9 x10e3/mcL (ref 1.0–3.2)
Lymphocytes: 21.7 % (ref 15.0–45.0)
MCH: 30.2 pg (ref 27.0–34.5)
MCHC: 34 g/dL (ref 30.0–36.0)
MCV: 89 fL (ref 81.0–99.0)
MPV: 9.8 fL (ref 7.0–12.2)
Monocytes %: 5.4 % (ref 4.0–12.0)
Monocytes Absolute: 0.5 x10e3/mcL (ref 0.3–1.0)
NRBC Absolute: 0 x10e3/mcL (ref 0.000–0.012)
NRBC Automated: 0 % (ref 0.0–0.2)
Neutrophils %: 69.8 % (ref 42.0–74.0)
Neutrophils Absolute: 6 x10e3/mcL (ref 1.6–7.3)
Platelets: 234 x10e3/mcL (ref 140–440)
RBC: 4.17 x10e6/mcL (ref 3.60–5.20)
RDW: 13.1 % (ref 10.0–17.0)
WBC: 8.6 x10e3/mcL (ref 3.8–10.6)

## 2024-01-12 LAB — ESTRADIOL: Estradiol: 213 pg/mL

## 2024-01-12 LAB — ALBUMIN/CREATININE RATIO, URINE
Albumin Urine: <1.2 mg/dL (ref 0.0–20.0)
Creatinine, Ur: 131 mg/dL (ref 28.0–217.0)

## 2024-01-12 LAB — LIPID PANEL
Chol/HDL Ratio: 4.5 — ABNORMAL HIGH (ref 0.0–4.4)
Cholesterol, Total: 159 mg/dL (ref 100–200)
HDL: 36 mg/dL — ABNORMAL LOW (ref >=50)
LDL Cholesterol: 91.9 mg/dL (ref 0.0–100.0)
LDL/HDL Ratio: 2.6
Triglycerides: 158 mg/dL — ABNORMAL HIGH (ref 0–149)
VLDL: 31.6 mg/dL (ref 5.0–40.0)

## 2024-01-12 LAB — VITAMIN D 25 HYDROXY: Vit D, 25-Hydroxy: 47.1 ng/mL (ref 30.0–90.0)

## 2024-01-12 LAB — HIV SCREEN: HIV Screen: NEGATIVE

## 2024-01-12 LAB — TSH REFLEX TO FT4: TSH: 1.66 u[IU]/mL (ref 0.358–3.740)

## 2024-01-12 LAB — HEMOGLOBIN A1C
Estimated Avg Glucose: 108
Estimated Avg Glucose: 115
Hemoglobin A1C: 5.4 % (ref 4.0–6.0)

## 2024-01-12 LAB — FOLLICLE STIMULATING HORMONE: FSH: 18.5 m[IU]/mL

## 2024-01-12 LAB — HEPATITIS C ANTIBODY: Hepatitis C Ab: NEGATIVE

## 2024-01-12 LAB — PROGESTERONE: Progesterone: <0.64 ng/mL

## 2024-01-15 LAB — ESTROGENS, TOTAL: Estrogen Total: 530 pg/mL

## 2024-01-18 LAB — TESTOSTERONE, FREE/TOT EQUILIB
Testosterone % Free: 1.73 % (ref 0.50–2.80)
Testosterone, Free: 0.78 ng/dL (ref 0.10–0.85)
Testosterone: 45 ng/dL (ref 4–50)

## 2024-02-07 ENCOUNTER — Ambulatory Visit
Admit: 2024-02-07 | Discharge: 2024-02-07 | Payer: Medicaid (Managed Care) | Attending: Student in an Organized Health Care Education/Training Program

## 2024-02-07 VITALS — BP 100/50 | HR 85 | Temp 98.10000°F | Ht 66.0 in | Wt 234.2 lb

## 2024-02-07 DIAGNOSIS — I10 Essential (primary) hypertension: Principal | ICD-10-CM

## 2024-02-07 MED ORDER — LISINOPRIL 2.5 MG PO TABS
2.5 | ORAL_TABLET | Freq: Every day | ORAL | 1 refills | 90.00000 days | Status: DC
Start: 2024-02-07 — End: 2024-02-07

## 2024-02-07 MED ORDER — ESTRADIOL 0.075 MG/24HR TD PTWK
0.075 | MEDICATED_PATCH | TRANSDERMAL | 1 refills | 84.00000 days | Status: AC
Start: 2024-02-07 — End: ?

## 2024-02-07 MED ORDER — SEMAGLUTIDE-WEIGHT MANAGEMENT 0.25 MG/0.5ML SC SOAJ
0.25 | SUBCUTANEOUS | 0 refills | 28.00000 days | Status: AC
Start: 2024-02-07 — End: ?

## 2024-02-07 MED ORDER — SEMAGLUTIDE-WEIGHT MANAGEMENT 0.25 MG/0.5ML SC SOAJ
0.25 | SUBCUTANEOUS | 0 refills | 56.00000 days | Status: DC
Start: 2024-02-07 — End: 2024-02-21

## 2024-02-07 NOTE — Progress Notes (Signed)
 "Date of Service: 02/07/2024  Patient: Shannon Marshall  DOB: May 04, 1970  Chief Complaint   Patient presents with    Follow-up    Discuss Labs    Other     Pt wanted to know if she could use the cream with the patch estrogen        Patient Care Team:  Roseann Laymon BRAVO, DO as PCP - General (Internal Medicine)  Roseann Laymon BRAVO, DO as PCP - Empaneled Provider  Schwartzberg, Heather  S, MD as Consulting Physician (Obstetrics & Gynecology)  Wetzel Mitchell FALCON, MD as Consulting Physician (Obstetrics & Gynecology)  Jodine Rodgers RAMAN, MD as Consulting Physician (Neurology)    History of Present Illness:  Shannon Marshall a 53 y.o. female presents today for evaluation of the following issues:  01/12/2024 lab review:  Albumin creatinine urine ratio normal, HIV negative, hep C negative, CBC stable, CMP stable with GFR 104, TSH stable 1.66, A1c 5.4, lipid panel stable with LDL 91.9, vitamin D 47.1    HRT/perimenopause:  Patient prescribed CombiPatch  in past.  Stopped CombiPatch  and start estradiol  0.1 mg patch and progesterone  100 mg 1 to 2 capsules at night on 01/10/2024.  Having breakthrough symptoms of fatigue, cold sweats, low energy, low libido, insomnia with sleep interruptions difficulty falling back asleep.  Does have a GYN.  LMP in August and irregular  Estradiol  213 (menopausal < 5-138)  Estrogen 530 (menopausal 40-244).  Can reduce estradiol  patch  Progesterone  <0.64 (0.0-0.1)  FSH 18.5 (postmenopausal 16.4-113.59) can also be midcycle peak 4.54-22.51  Testosterone 45 (0-50), testosterone 0.78 (0.10-0.85)    Insomnia: improved. Is taking 100 mg most nights. Has some nights still waking with difficulty falling back asleep. Encouraged to take 2 capsules at night. Can add magnesium glycinate per bottle instructions.     Pt feels better overall. Less neuropathy. Able to play pickle ball. Mood is more stable. Cold flushes has improved. Denies hot flashes. Less swelling in ankles and body pain. No issue with vaginal  dryness using the patch. Stopped using the vaginal cream. Will D/C for now.     Hypertension follow-up:  Home blood pressure monitoring results:  Medication change at last visit: prescribed minipress /prazosin  by psychology for nightmares. Could be lowering bp. Will stop lisinopril  5 mg and have patient monitor.     Weight loss   Diets/programs/Medications/Surgeries in past/current:  Weight loss medications prescribed:  Start date: 02/07/2024  Goal weight: 160-170 lb  Weight at medication or weight loss plan start: 234 lbs  Waist circumference:  BMI at medication or weight loss plan start 37.8  Side effect:  Last A1c: 5.4 01/2024  Last TSH:1.66 01/2024  FIB-4 score: FIB-4 Calculation: 0.73 at 01/12/2024 10:38 AM  Calculated from:  SGOT/AST: 14 unit/L at 01/12/2024 10:38 AM  SGPT/ALT: 18 unit/L at 01/12/2024 10:38 AM  Platelets: 234 x10e3/mcL at 01/12/2024 10:38 AM  Age: 67 years   Weight change since last visit: tbd lbs  Current weight: 234 lbs  Current BMI:  37.8  Total weight loss: tbd lbs    Food diary: Pt encouraged to download a free app on phone to track calories and quality of food    Meal plan compliance: TBD calories per day. Reduce daily calories by 500 per day to lose approximately 1 lb a week.   Protein per day should be 1 gram per kg of body weight. Ex. 70 kg person would need 70 grams of protein per day.      Daily water intake in  ounces should be 1 ounce per half the body weight in lbs. Ex. 180 lb person would need 90 ounces.     Physical activity:at least 30 mins of moderate intensity (when you can walk and talk but not walk and sing) no less than 5 days a week.       Current Outpatient Medications   Medication Sig Dispense Refill    estradiol  (CLIMARA ) 0.075 MG/24HR Place 1 patch onto the skin once a week 12 patch 1    Semaglutide -Weight Management (WEGOVY ) 0.25 MG/0.5ML SOAJ SC injection Inject 0.25 mg into the skin every 7 days 2 mL 0    Semaglutide -Weight Management (WEGOVY ) 0.25 MG/0.5ML SOAJ SC  injection Inject 0.25 mg into the skin every 7 days 2 mL 0    busPIRone  (BUSPAR ) 5 MG tablet Take 1 tablet by mouth 2 times daily      progesterone  (PROMETRIUM ) 100 MG CAPS capsule Take 100 to 200 mg by mouth at night 60 capsule 0    SUMAtriptan  (IMITREX ) 50 MG tablet Take 1 tablet by mouth once as needed for Migraine 8 tablet 3    estradiol -norethindrone  (COMBIPATCH ) 0.05-0.14 MG/DAY Place 1 patch onto the skin Twice a Week 24 patch 3    prazosin  (MINIPRESS ) 1 MG capsule TAKE 1 CAPSULE BY MOUTH AT BEDTIME- MAY LOWER BLOOD PRESSURE      vitamin D 25 MCG (1000 UT) CAPS Take by mouth      Blood Pressure Monitor DEVI 1 each by Does not apply route as needed (hypertension) 1 each 0    fluticasone  (FLONASE) 50 MCG/ACT nasal spray 2 sprays by Nasal route as needed for Allergies       No current facility-administered medications for this visit.       Allergies   Allergen Reactions    Penicillins Anaphylaxis and Swelling            Patient Active Problem List   Diagnosis    Perimenopause    Metrorrhagia    Mixed stress and urge urinary incontinence    Urinary hesitancy    Irregular menses    Obesity, unspecified    Precordial pain    Essential hypertension    Acute bacterial sinusitis    Gastroesophageal reflux disease without esophagitis    Abnormal ECG    Hypertensive heart disease without heart failure    Chronic migraine without aura without status migrainosus, not intractable    Paresthesias     Past Medical History:   Diagnosis Date    Anxiety     Blood type, Rh negative     Gastroesophageal reflux disease without esophagitis 06/14/2021    Hypertension     Migraine     Seasonal allergies     Sleep apnea      Past Surgical History:   Procedure Laterality Date    BREAST BIOPSY Right 2015    CESAREAN SECTION N/A     2008 2010 2013    COLONOSCOPY N/A 03/31/2021    COLONOSCOPY DIAGNOSTIC performed by Curtistine CHRISTELLA Pearl, MD at [facility]    DILATION AND CURETTAGE N/A 1997    SINUS SURGERY      WISDOM TOOTH EXTRACTION        Family History   Problem Relation Age of Onset    High Cholesterol Father     High Blood Pressure Father     Coronary Art Dis Father     Heart Attack Father     Alcohol Abuse Father  High Blood Pressure Brother     Breast Cancer Paternal Aunt     High Cholesterol Maternal Grandmother     High Blood Pressure Maternal Grandmother     Diabetes Maternal Grandmother     Stroke Maternal Grandmother     Diabetes Paternal Grandmother     Heart Disease Paternal Grandmother      Social History     Tobacco Use    Smoking status: Never    Smokeless tobacco: Never   Substance Use Topics    Alcohol use: Never     Social History     Social History Narrative    Not on file         ROS:    Review of Systems    See HPI, all other ROS are negative         BP (!) 100/50 (BP Site: Left Upper Arm, Patient Position: Sitting)   Pulse 85   Temp 98.1 F (36.7 C)   Ht 1.676 m (5' 6)   Wt 106.2 kg (234 lb 3.2 oz)   SpO2 97%   BMI 37.80 kg/m     General: conversive, pleasant, awake, alert   HEENT:  conjunctiva normal, Neck supple, no lymphadenopathy, mucus membranes moist, no thyromegaly  Respiratory: breath sounds clear throughout, non-labored breathing  Cardiovascular: RRR, no MGR, no carotid bruits, radial and pedal pulses, no edema  GI: non-distended, non-tender, soft, no liver edge, no masses  GU: no suprapubic tenderness  Neuro: A/O x4, speech normal, CN II-XII intact, no focal motor or sensory deficits  MSK: Full ROM in BUE and BLE  Skin: no rashes, warm, dry  Psychiatric: cooperative  Reproductive: deferred     ASSESSMENT and PLAN:  1. Hot flashes    - estradiol  (CLIMARA ) 0.075 MG/24HR; Place 1 patch onto the skin once a week  Dispense: 12 patch; Refill: 1    2. Perimenopause    - estradiol  (CLIMARA ) 0.075 MG/24HR; Place 1 patch onto the skin once a week  Dispense: 12 patch; Refill: 1  - Estrogens, Total; Future  - Progesterone ; Future    3. Essential hypertension    - Semaglutide -Weight Management (WEGOVY ) 0.25 MG/0.5ML  SOAJ SC injection; Inject 0.25 mg into the skin every 7 days  Dispense: 2 mL; Refill: 0  - Semaglutide -Weight Management (WEGOVY ) 0.25 MG/0.5ML SOAJ SC injection; Inject 0.25 mg into the skin every 7 days  Dispense: 2 mL; Refill: 0    4. Weight loss counseling, encounter for    - Semaglutide -Weight Management (WEGOVY ) 0.25 MG/0.5ML SOAJ SC injection; Inject 0.25 mg into the skin every 7 days  Dispense: 2 mL; Refill: 0  Weight loss instructions  - We'll begin patient on a higher protein, lower carbohydrate meal plan.   -Discuss with patient goal for BMI and the health risks of Obesity including but not limited to diabetes, insulin resistance, hypertension, hyperlipidemia, heart disease, stroke, arthritis, sleep apnea, and some cancers.   -Discuss with a weight loss plan to consume less calories than patient burns per day. Assist patient in setting a safe total daily calorie goal. Advise to eat small frequent meals and discuss serving size. Discuss food choices- avoid processed foods and foods and drinks high in carbohydrates/sugars. Patient encouraged to consume 3 meals a day with limited snacking.   -Based on the measured caloric expenditure and current weight, the meal plan will include approximately 1g protein/kg body weight. Protein goal is 100-200  grams of protein per day.  - Patient  may have one serving of starch per meal with serving size of  1/2  cup. Patient may have up to 2 fruit servings a day.   Patient may have unlimited nonstarchy vegetables per day.  - Patient is encouraged to drink 80-100 ounces of water per day with a minimum of 64 ounces per day.  No eating and drinking at the same time.  - Patient is asked to complete a food journal whether this is myfitnesspal, [employer] weight loss app, or another of the patient's choosing.  - Labs today:needs to complete labs Further treatment based on lab results.  - Encouraged patient to increase activity level. Ultimate goal will be one hour at least 5 days a  week. Discuss an exercise plan tailored to the patient's specific health and mobility and accessibility to resources i.e. gym, exercise equipment, etc.    -Reviewed management of stress. Stress can lead to the release of the stress hormone cortisol that can cause weight gain. Excess cortisol can increase the appetite. The rise of cortisol can turn overeating into a habit. Because increased levels of the hormone help cause higher insulin levels, blood sugar drops and it can cause cravings for sweet, fatty and salty foods, behaviors that lead to weight gain and make it harder to lose or maintain weight.   - Reviewed need for long-term use of medication to successfully treat weight.  Have reviewed benefits and side effects of phentermine, phentermine plus topiramate, bupropion plus naltrexone, and GLP-1 receptor agonists including Saxenda, Wegovy , and Zepbound .   GLP-1's like Wegovy  (semaglutide ), Zepbound  (tirzepatide ) and Saxenda (liraglutide) are FDA approved for weight loss, and are not clinically appropriate for everyone.  Wegovy  can be approved with A-fib-4 score of at least 1.3-2.67.  Zepbound  can be approved with moderate sleep apnea or worse.  The GLP-1's Ozempic  (semaglutide ) and GLP-1/GIP Mounjaro  (tirzepatide ) are FDA approved for diabetes. From time to time GLP-1's may be difficult to access due to high costs, limited insurance coverage, and occasional shortages. Up to 30% of patients treated with these medications do not lose a significant amount of weight, and are classified non-responders. The GLP-1's are prescribed as life-long medications, otherwise, the patient risks weight regain and the loss of any cardioprotective benefits.      Other weight-loss medications may be better suited to a patient's biology and lifestyle. Those effective FDA-approved prescription medications that may aid in weight loss, directly or indirectly, include metformin, Contrave (bupropion with naltrexone), phentermine,  topiramate, zonisamide, Qsymia (phentermine with topiramate), and Vyvanse (lisdexamfetamine dimesylate). All medications are prescribed or discontinued at the weight loss provider's discretion and at any time on a patient's weight loss journey.     We will order baseline labwork for assessment of possible contributing factors leading to the patient's current state of obesity.  These fasting labs may include CBC, CMP, lipid panel, hemoglobin A1c, TSH, insulin level, and 25-hydroxy vitamin D levels.  The results will be interpreted and these results may affect medical recommendations going forward.         5. Severe obesity (BMI 35.0-39.9) with comorbidity (HCC)    - Semaglutide -Weight Management (WEGOVY ) 0.25 MG/0.5ML SOAJ SC injection; Inject 0.25 mg into the skin every 7 days  Dispense: 2 mL; Refill: 0  - Semaglutide -Weight Management (WEGOVY ) 0.25 MG/0.5ML SOAJ SC injection; Inject 0.25 mg into the skin every 7 days  Dispense: 2 mL; Refill: 0       Orders Placed This Encounter   Procedures    Estrogens, Total  Standing Status:   Future     Expected Date:   02/07/2024     Expiration Date:   02/06/2025    Progesterone      Standing Status:   Future     Expected Date:   02/07/2024     Expiration Date:   02/06/2025       Return in about 2 weeks (around 02/21/2024) for htn bp recheck.       "

## 2024-02-09 ENCOUNTER — Encounter

## 2024-02-20 ENCOUNTER — Ambulatory Visit: Admit: 2024-02-20 | Discharge: 2024-02-20 | Payer: Medicaid (Managed Care) | Attending: Physician Assistant

## 2024-02-20 ENCOUNTER — Ambulatory Visit: Admit: 2024-02-20 | Discharge: 2024-02-20 | Payer: Medicaid (Managed Care)

## 2024-02-20 ENCOUNTER — Encounter: Payer: Medicaid (Managed Care) | Attending: Neurology

## 2024-02-20 VITALS — BP 124/77 | HR 88 | Temp 98.00000°F | Resp 18 | Ht 67.0 in | Wt 233.4 lb

## 2024-02-20 DIAGNOSIS — M25512 Pain in left shoulder: Principal | ICD-10-CM

## 2024-02-20 MED ORDER — DEXAMETHASONE SODIUM PHOSPHATE 10 MG/ML IJ SOLN
10 | Freq: Once | INTRAMUSCULAR | Status: AC
Start: 2024-02-20 — End: 2024-02-20
  Administered 2024-02-20: 20:00:00 10 mg via INTRAMUSCULAR

## 2024-02-20 NOTE — Progress Notes (Signed)
 "  Shannon Marshall (DOB:  1970/05/31) is a 53 y.o. female,Established patient, here for evaluation of the following chief complaint(s):  Shoulder Pain (Left arm Shoulder pain  x 1 day)      Assessment & Plan :  Visit Diagnoses and Associated Orders         Acute pain of left shoulder    -  Primary    XR SHOULDER LEFT (MIN 2 VIEWS) [26969 CPT(R)]   - Future Order    dexAMETHasone  (DECADRON ) IntraMUSCular 10 mg [2331]             Strain of left trapezius muscle, initial encounter               Tension headache                        Subjective :  53 YO FEMALE PRESENT TO [facility] WITH LEFT SIDED SHOULDER PAIN, TRAPEZIUS PAIN AND TENSION TYPE HA.  FEELS LIKE SHE MAY HAVE PULLED LEFT SHOULDER WHILE CARING FOR HER 17 YO DAUGHTER WHO HAS A DISABILITY.  ADMITS NO OTHER MOI, FALL OR TRAUMA.  NO HX OF LEFT SHOULDER PAIN PRIOR. ADMITS MUSCLES ALONG UPPER CHEST AND BACK FEEL INFLAMED.  NO RECENT ILLNESS. NO F/C/N/V.  NO COUGH.   NO HX OF SHOULDER DISLOCATION.   RHD.        Shoulder Pain                   Vitals:    02/20/24 1450   BP: 124/77   Pulse: 88   Resp: 18   Temp: 98 F (36.7 C)   TempSrc: Oral   SpO2: 99%   Weight: 105.9 kg (233 lb 6.4 oz)   Height: 1.702 m (5' 7)       No results found for this visit on 02/20/24.      Objective   Physical Exam  Vitals reviewed.   Constitutional:       General: She is not in acute distress.     Appearance: Normal appearance. She is not ill-appearing or toxic-appearing.   HENT:      Head: Normocephalic and atraumatic.   Cardiovascular:      Rate and Rhythm: Normal rate and regular rhythm.   Pulmonary:      Effort: Pulmonary effort is normal.      Breath sounds: Normal breath sounds.   Musculoskeletal:      Right shoulder: Normal.      Left shoulder: Tenderness present. No swelling, deformity, effusion, bony tenderness or crepitus. Normal range of motion. Normal strength. Normal pulse.      Right hand: Normal strength. Normal sensation. There is no disruption of two-point discrimination.  Normal capillary refill. Normal pulse.      Left hand: Normal strength. Normal sensation. There is no disruption of two-point discrimination. Normal capillary refill. Normal pulse.        Arms:       Cervical back: Normal range of motion and neck supple. Spasms and tenderness present. No swelling, deformity, signs of trauma, rigidity, bony tenderness or crepitus. No pain with movement.      Thoracic back: Normal.        Back:    Lymphadenopathy:      Cervical: No cervical adenopathy.   Skin:     General: Skin is warm and dry.   Neurological:      General: No focal deficit present.  Mental Status: She is alert and oriented to person, place, and time.      GCS: GCS eye subscore is 4. GCS verbal subscore is 5. GCS motor subscore is 6.      Gait: Gait is intact.   Psychiatric:         Mood and Affect: Mood normal.     XR SHOULDER LEFT-NEG  LIKELY SOFT TISSUE INFLAMMATION (BICEPS TENDON/TRAPEZIUS)    TAKING ADVIL  AT HOME.   RECOMMEND CONTINUE THIS AM AND PM.   ADD LOW DOSE MUSCLE RELAXER FOR UPPER BACK/MUSCLE PAIN AND TENSION, CAUTION DROWSINESS.   RECOMMEND NO HEAVY LIFTING, NO PROLONGED STANDING, NO EXERTIONAL WEIGHT BEARING ACTIVITY. ICE TWICE DAY TO AFFECTED JOINT. REVIEW E/SE/INDICATION FOR TREATMENT NOW. ADVISE GRADUAL IMPROVEMENT IN NEXT 7-10 DAYS. RECOMMEND F/U IN NEXT 7-10 DAYS WTIH PCP IF NO IMPROVEMENTOF SYMPTOMS.  F/U WITH ORTHO AS CONSULTED           An electronic signature was used to authenticate this note.    Elveria LOISE Sharps, PA-C         "

## 2024-02-20 NOTE — Patient Instructions (Addendum)
"  XR SHOULDER LEFT UNREMARKABLE  TAKING ADVIL  AT HOME.   RECOMMEND CONTINUE THIS AM AND PM.   ADD LOW DOSE MUSCLE RELAXER FOR UPPER BACK/MUSCLE PAIN AND TENSION, CAUTION DROWSINESS.   RECOMMEND NO HEAVY LIFTING, NO PROLONGED STANDING, NO EXERTIONAL WEIGHT BEARING ACTIVITY. ICE TWICE DAY TO AFFECTED JOINT. REVIEW E/SE/INDICATION FOR TREATMENT NOW. ADVISE GRADUAL IMPROVEMENT IN NEXT 7-10 DAYS. RECOMMEND F/U IN NEXT 7-10 DAYS WTIH PCP IF NO IMPROVEMENTOF SYMPTOMS.    ED FOR CHEST PAIN, SEVERE HEADACHE  "

## 2024-02-20 NOTE — Telephone Encounter (Signed)
"  Unable to reach patient. No dislocation of fracture. Please inform patiient. VM Full  "

## 2024-02-21 ENCOUNTER — Ambulatory Visit
Admit: 2024-02-21 | Discharge: 2024-02-21 | Payer: Medicaid (Managed Care) | Attending: Student in an Organized Health Care Education/Training Program

## 2024-02-21 VITALS — BP 110/70 | HR 97 | Ht 66.0 in | Wt 238.0 lb

## 2024-02-21 DIAGNOSIS — F419 Anxiety disorder, unspecified: Principal | ICD-10-CM

## 2024-02-21 MED ORDER — SEMAGLUTIDE-WEIGHT MANAGEMENT 0.25 MG/0.5ML SC SOAJ
0.25 | SUBCUTANEOUS | 2 refills | 56.00000 days | Status: AC
Start: 2024-02-21 — End: ?

## 2024-02-21 MED ORDER — PROGESTERONE 200 MG PO CAPS
200 | ORAL_CAPSULE | Freq: Every evening | ORAL | 1 refills | 90.00000 days | Status: AC
Start: 2024-02-21 — End: ?

## 2024-02-21 MED ORDER — BUSPIRONE HCL 7.5 MG PO TABS
7.5 | ORAL_TABLET | Freq: Two times a day (BID) | ORAL | 1 refills | 60.00000 days | Status: AC
Start: 2024-02-21 — End: 2024-08-19

## 2024-02-21 NOTE — Progress Notes (Signed)
 "Date of Service: 02/21/2024  Patient: Shannon Marshall  DOB: October 12, 1970  Chief Complaint   Patient presents with    Follow-up       Patient Care Team:  Roseann Laymon BRAVO, DO as PCP - General (Internal Medicine)  Roseann Laymon BRAVO, DO as PCP - Empaneled Provider  Schwartzberg, Heather  S, MD as Consulting Physician (Obstetrics & Gynecology)  Wetzel Mitchell FALCON, MD as Consulting Physician (Obstetrics & Gynecology)  Jodine Rodgers RAMAN, MD as Consulting Physician (Neurology)    History of Present Illness:  Shannon Marshall a 53 y.o. female presents today for evaluation of the following issues:  Hypertension follow-up:  Home blood pressure monitoring results:not monitoring  Medication change at last visit: Stop lisinopril  5 mg due to patient taking Minipress /prazosin  by mental health for nightmares  Previous symptoms improved    Weight loss:  Prescribed Wegovy  on 02/07/2024.  Weight increased 4 pounds since 02/07/2024. Pt is using her samples. Unable to pick up at [facility].. will send to [facility].    HRT:   Is taking progesterone  200 mg every night. Will change prescription to 200 mg.     Anxiety:  Prescribed Buspar  for anxiety through mental health. Currently taking 5 mg twice daily. Is a little helpful but could be better. Missed her last appt with mental health and request medication be managed with pcp/here. Discussed increasing to 7.5 mg. Pt in agreement  Current Outpatient Medications   Medication Sig Dispense Refill    Semaglutide -Weight Management (WEGOVY ) 0.25 MG/0.5ML SOAJ SC injection Inject 0.25 mg into the skin every 7 days 2 mL 2    progesterone  (PROMETRIUM ) 200 MG CAPS capsule Take 1 capsule by mouth nightly 90 capsule 1    busPIRone  (BUSPAR ) 7.5 MG tablet Take 1 tablet by mouth 2 times daily 180 tablet 1    estradiol  (CLIMARA ) 0.075 MG/24HR Place 1 patch onto the skin once a week 12 patch 1    SUMAtriptan  (IMITREX ) 50 MG tablet Take 1 tablet by mouth once as needed for Migraine 8 tablet 3    prazosin  (MINIPRESS )  1 MG capsule TAKE 1 CAPSULE BY MOUTH AT BEDTIME- MAY LOWER BLOOD PRESSURE      vitamin D 25 MCG (1000 UT) CAPS Take by mouth      Blood Pressure Monitor DEVI 1 each by Does not apply route as needed (hypertension) 1 each 0    fluticasone  (FLONASE) 50 MCG/ACT nasal spray 2 sprays by Nasal route as needed for Allergies      Semaglutide -Weight Management (WEGOVY ) 0.25 MG/0.5ML SOAJ SC injection Inject 0.25 mg into the skin every 7 days (Patient not taking: Reported on 02/21/2024) 2 mL 0     No current facility-administered medications for this visit.       Allergies   Allergen Reactions    Penicillins Anaphylaxis and Swelling            Patient Active Problem List   Diagnosis    Perimenopause    Metrorrhagia    Mixed stress and urge urinary incontinence    Urinary hesitancy    Irregular menses    Obesity, unspecified    Precordial pain    Essential hypertension    Acute bacterial sinusitis    Gastroesophageal reflux disease without esophagitis    Abnormal ECG    Hypertensive heart disease without heart failure    Chronic migraine without aura without status migrainosus, not intractable    Paresthesias     Past Medical History:   Diagnosis Date  Anxiety     Blood type, Rh negative     Gastroesophageal reflux disease without esophagitis 06/14/2021    Hypertension     Migraine     Seasonal allergies     Sleep apnea      Past Surgical History:   Procedure Laterality Date    BREAST BIOPSY Right 2015    CESAREAN SECTION N/A     2008 2010 2013    COLONOSCOPY N/A 03/31/2021    COLONOSCOPY DIAGNOSTIC performed by Curtistine CHRISTELLA Pearl, MD at [facility]    DILATION AND CURETTAGE N/A 1997    SINUS SURGERY      WISDOM TOOTH EXTRACTION       Family History   Problem Relation Age of Onset    High Cholesterol Father     High Blood Pressure Father     Coronary Art Dis Father     Heart Attack Father     Alcohol Abuse Father     High Blood Pressure Brother     Breast Cancer Paternal Aunt     High Cholesterol Maternal Grandmother     High  Blood Pressure Maternal Grandmother     Diabetes Maternal Grandmother     Stroke Maternal Grandmother     Diabetes Paternal Grandmother     Heart Disease Paternal Grandmother      Social History     Tobacco Use    Smoking status: Never    Smokeless tobacco: Never   Substance Use Topics    Alcohol use: Never     Social History     Social History Narrative    Not on file         ROS:    Review of Systems    See HPI, all other ROS are negative         BP 110/70 (BP Site: Right Upper Arm, Patient Position: Sitting)   Pulse 97   Ht 1.676 m (5' 6)   Wt 108 kg (238 lb)   SpO2 97%   BMI 38.41 kg/m     General: conversive, pleasant, awake, alert   HEENT: conjunctiva normal, Neck supple, no lymphadenopathy, mucus membranes moist, no thyromegaly  Respiratory: breath sounds clear throughout, non-labored breathing  Cardiovascular: RRR, no MGR, no carotid bruits, radial and pedal pulses, no edema  GI: non-distended  Neuro: A/O x4, speech normal, CN II-XII intact, no focal motor or sensory deficits  MSK: Full ROM in BUE and BLE  Skin: no rashes, warm, dry  Psychiatric: cooperative  Reproductive: deferred     ASSESSMENT and PLAN:  1. Essential hypertension  -continue with prazosin  as prescribed by outside provider    - Semaglutide -Weight Management (WEGOVY ) 0.25 MG/0.5ML SOAJ SC injection; Inject 0.25 mg into the skin every 7 days  Dispense: 2 mL; Refill: 2    2. Severe obesity (BMI 35.0-39.9) with comorbidity (HCC)    - Semaglutide -Weight Management (WEGOVY ) 0.25 MG/0.5ML SOAJ SC injection; Inject 0.25 mg into the skin every 7 days  Dispense: 2 mL; Refill: 2  -pt encouraged to send a Mychart message if needing dose increase before follow up. Goal is to lose 1-2 lb per week  3. Perimenopause    - progesterone  (PROMETRIUM ) 200 MG CAPS capsule; Take 1 capsule by mouth nightly  Dispense: 90 capsule; Refill: 1    4. Anxiety    increased- busPIRone  (BUSPAR ) 7.5 MG tablet; Take 1 tablet by mouth 2 times daily  Dispense: 180  tablet; Refill: 1  No orders of the defined types were placed in this encounter.      Return in about 3 months (around 05/21/2024) for weight loss.       "

## 2024-02-23 ENCOUNTER — Encounter

## 2024-02-27 ENCOUNTER — Telehealth

## 2024-02-27 MED ORDER — PRAZOSIN HCL 1 MG PO CAPS
1 | ORAL_CAPSULE | Freq: Every evening | ORAL | 1 refills | 30.00000 days | Status: DC
Start: 2024-02-27 — End: 2024-04-02

## 2024-02-27 NOTE — Telephone Encounter (Signed)
"  Pt came in in for a refill for Prazosin  1 MG, states she has 1 pill left.  TAKE 1 CAPSULE BY MOUTH AT BEDTIME- MAY LOWER BLOOD PRESSURE   "

## 2024-04-02 ENCOUNTER — Encounter

## 2024-04-02 MED ORDER — PRAZOSIN HCL 1 MG PO CAPS
1 | ORAL_CAPSULE | Freq: Every evening | ORAL | 1 refills | Status: AC
Start: 2024-04-02 — End: 2024-09-29

## 2024-04-02 NOTE — Telephone Encounter (Signed)
 Pt came into office requesting refill of Prazosin  1mg  ASAP.    Please advise

## 2024-04-23 ENCOUNTER — Ambulatory Visit: Payer: Medicaid (Managed Care)
